# Patient Record
Sex: Female | Born: 1980 | Race: Black or African American | Hispanic: No | Marital: Single | Smoking: Current every day smoker
Health system: Southern US, Community
[De-identification: ages and names within clinical notes are randomized; demographics above are authoritative.]

## PROBLEM LIST (undated history)

## (undated) DIAGNOSIS — N83209 Unspecified ovarian cyst, unspecified side: Secondary | ICD-10-CM

## (undated) DIAGNOSIS — F419 Anxiety disorder, unspecified: Secondary | ICD-10-CM

## (undated) DIAGNOSIS — E7800 Pure hypercholesterolemia, unspecified: Secondary | ICD-10-CM

## (undated) HISTORY — PX: CHOLECYSTECTOMY: SHX55

---

## 2005-05-22 ENCOUNTER — Emergency Department: Payer: Self-pay | Admitting: Emergency Medicine

## 2005-09-08 ENCOUNTER — Emergency Department: Payer: Self-pay | Admitting: Emergency Medicine

## 2005-09-30 ENCOUNTER — Emergency Department: Payer: Self-pay | Admitting: Emergency Medicine

## 2005-10-04 ENCOUNTER — Other Ambulatory Visit: Payer: Self-pay

## 2005-10-04 ENCOUNTER — Emergency Department: Payer: Self-pay | Admitting: Internal Medicine

## 2006-01-20 ENCOUNTER — Emergency Department: Payer: Self-pay | Admitting: General Practice

## 2006-01-24 ENCOUNTER — Emergency Department: Payer: Self-pay | Admitting: Emergency Medicine

## 2006-03-17 ENCOUNTER — Emergency Department: Payer: Self-pay | Admitting: Emergency Medicine

## 2006-05-02 ENCOUNTER — Emergency Department: Payer: Self-pay | Admitting: Emergency Medicine

## 2006-05-02 ENCOUNTER — Other Ambulatory Visit: Payer: Self-pay

## 2006-09-06 ENCOUNTER — Emergency Department: Payer: Self-pay | Admitting: Emergency Medicine

## 2006-09-06 ENCOUNTER — Other Ambulatory Visit: Payer: Self-pay

## 2006-11-08 ENCOUNTER — Ambulatory Visit: Payer: Self-pay

## 2007-04-25 ENCOUNTER — Emergency Department: Payer: Self-pay | Admitting: Emergency Medicine

## 2007-04-25 ENCOUNTER — Other Ambulatory Visit: Payer: Self-pay

## 2007-06-28 ENCOUNTER — Emergency Department: Payer: Self-pay | Admitting: Emergency Medicine

## 2008-04-24 ENCOUNTER — Ambulatory Visit: Payer: Self-pay | Admitting: Family Medicine

## 2009-08-21 ENCOUNTER — Emergency Department: Payer: Self-pay | Admitting: Emergency Medicine

## 2010-01-03 ENCOUNTER — Emergency Department: Payer: Self-pay | Admitting: Emergency Medicine

## 2010-01-20 ENCOUNTER — Emergency Department: Payer: Self-pay | Admitting: Emergency Medicine

## 2010-06-19 ENCOUNTER — Emergency Department: Payer: Self-pay | Admitting: Unknown Physician Specialty

## 2010-06-29 ENCOUNTER — Ambulatory Visit: Payer: Self-pay | Admitting: Orthopedic Surgery

## 2011-01-06 ENCOUNTER — Ambulatory Visit: Payer: Self-pay | Admitting: Family Medicine

## 2011-01-22 ENCOUNTER — Observation Stay: Payer: Self-pay | Admitting: Obstetrics and Gynecology

## 2011-02-26 ENCOUNTER — Observation Stay: Payer: Self-pay | Admitting: Obstetrics and Gynecology

## 2011-03-16 ENCOUNTER — Observation Stay: Payer: Self-pay | Admitting: Obstetrics and Gynecology

## 2011-03-21 ENCOUNTER — Inpatient Hospital Stay: Payer: Self-pay | Admitting: Obstetrics and Gynecology

## 2011-05-06 ENCOUNTER — Inpatient Hospital Stay: Payer: Self-pay | Admitting: Surgery

## 2011-05-11 LAB — PATHOLOGY REPORT

## 2011-08-27 ENCOUNTER — Emergency Department: Payer: Self-pay | Admitting: Emergency Medicine

## 2012-03-06 ENCOUNTER — Emergency Department: Payer: Self-pay | Admitting: *Deleted

## 2012-03-06 LAB — COMPREHENSIVE METABOLIC PANEL
Albumin: 3.8 g/dL (ref 3.4–5.0)
Anion Gap: 13 (ref 7–16)
Bilirubin,Total: 0.4 mg/dL (ref 0.2–1.0)
Calcium, Total: 8.8 mg/dL (ref 8.5–10.1)
Creatinine: 0.92 mg/dL (ref 0.60–1.30)
EGFR (African American): >60
EGFR (Non-African Amer.): >60
Glucose: 106 mg/dL — ABNORMAL HIGH (ref 65–99)
Osmolality: 280 (ref 275–301)
Potassium: 3.3 mmol/L — ABNORMAL LOW (ref 3.5–5.1)
Sodium: 141 mmol/L (ref 136–145)
Total Protein: 8.2 g/dL (ref 6.4–8.2)

## 2012-03-06 LAB — CBC
HGB: 13.4 g/dL (ref 12.0–16.0)
MCHC: 33.8 g/dL (ref 32.0–36.0)
MCV: 81 fL (ref 80–100)
Platelet: 312 10*3/uL (ref 150–440)
RBC: 4.87 10*6/uL (ref 3.80–5.20)
WBC: 11.4 10*3/uL — ABNORMAL HIGH (ref 3.6–11.0)

## 2012-03-07 LAB — TROPONIN I: Troponin-I: <0.02 ng/mL

## 2012-04-11 ENCOUNTER — Ambulatory Visit: Payer: Self-pay | Admitting: Family Medicine

## 2012-09-09 ENCOUNTER — Emergency Department: Payer: Self-pay | Admitting: Emergency Medicine

## 2012-09-09 LAB — URINALYSIS, COMPLETE
Bilirubin,UR: NEGATIVE
Blood: NEGATIVE
Glucose,UR: NEGATIVE mg/dL (ref 0–75)
Nitrite: NEGATIVE
Protein: NEGATIVE
Specific Gravity: 1.02 (ref 1.003–1.030)
Squamous Epithelial: 2

## 2012-09-09 LAB — COMPREHENSIVE METABOLIC PANEL
Albumin: 3.8 g/dL (ref 3.4–5.0)
Alkaline Phosphatase: 73 U/L (ref 50–136)
Anion Gap: 10 (ref 7–16)
BUN: 6 mg/dL — ABNORMAL LOW (ref 7–18)
Calcium, Total: 9.2 mg/dL (ref 8.5–10.1)
Creatinine: 0.87 mg/dL (ref 0.60–1.30)
Glucose: 97 mg/dL (ref 65–99)
Osmolality: 279 (ref 275–301)
Potassium: 3.3 mmol/L — ABNORMAL LOW (ref 3.5–5.1)
SGPT (ALT): 13 U/L (ref 12–78)
Sodium: 141 mmol/L (ref 136–145)
Total Protein: 8.2 g/dL (ref 6.4–8.2)

## 2012-09-09 LAB — CBC
HGB: 14.1 g/dL (ref 12.0–16.0)
MCH: 28.2 pg (ref 26.0–34.0)
MCHC: 34.1 g/dL (ref 32.0–36.0)
MCV: 83 fL (ref 80–100)
RBC: 5.01 10*6/uL (ref 3.80–5.20)

## 2012-09-09 LAB — TROPONIN I: Troponin-I: <0.02 ng/mL

## 2012-12-24 ENCOUNTER — Emergency Department: Payer: Self-pay | Admitting: Emergency Medicine

## 2012-12-24 LAB — COMPREHENSIVE METABOLIC PANEL
Albumin: 4.1 g/dL (ref 3.4–5.0)
Alkaline Phosphatase: 79 U/L (ref 50–136)
Bilirubin,Total: 0.5 mg/dL (ref 0.2–1.0)
Chloride: 109 mmol/L — ABNORMAL HIGH (ref 98–107)
Co2: 27 mmol/L (ref 21–32)
Creatinine: 0.86 mg/dL (ref 0.60–1.30)
EGFR (African American): >60
EGFR (Non-African Amer.): >60
Osmolality: 277 (ref 275–301)
SGOT(AST): 18 U/L (ref 15–37)
Sodium: 140 mmol/L (ref 136–145)

## 2012-12-24 LAB — URINALYSIS, COMPLETE
Ketone: NEGATIVE
Ph: 6 (ref 4.5–8.0)
Protein: NEGATIVE
RBC,UR: <1 /HPF (ref 0–5)
WBC UR: <1 /HPF (ref 0–5)

## 2012-12-24 LAB — CBC
HCT: 43.2 % (ref 35.0–47.0)
MCHC: 34.5 g/dL (ref 32.0–36.0)
MCV: 83 fL (ref 80–100)
Platelet: 312 10*3/uL (ref 150–440)
RDW: 14.5 % (ref 11.5–14.5)
WBC: 9 10*3/uL (ref 3.6–11.0)

## 2012-12-24 LAB — PREGNANCY, URINE: Pregnancy Test, Urine: NEGATIVE m[IU]/mL

## 2013-04-12 ENCOUNTER — Ambulatory Visit: Payer: Self-pay | Admitting: Family Medicine

## 2013-05-06 ENCOUNTER — Emergency Department: Payer: Self-pay | Admitting: Emergency Medicine

## 2013-05-06 LAB — BASIC METABOLIC PANEL
BUN: 4 mg/dL — ABNORMAL LOW (ref 7–18)
Calcium, Total: 9.3 mg/dL (ref 8.5–10.1)
Co2: 24 mmol/L (ref 21–32)
EGFR (Non-African Amer.): >60
Potassium: 3.5 mmol/L (ref 3.5–5.1)

## 2013-05-06 LAB — URINALYSIS, COMPLETE
Bilirubin,UR: NEGATIVE
Blood: NEGATIVE
Nitrite: NEGATIVE
RBC,UR: 1 /HPF (ref 0–5)
Specific Gravity: 1.028 (ref 1.003–1.030)
Squamous Epithelial: 1

## 2013-05-06 LAB — CBC
HCT: 41 % (ref 35.0–47.0)
MCH: 28.6 pg (ref 26.0–34.0)
MCHC: 34.9 g/dL (ref 32.0–36.0)
MCV: 82 fL (ref 80–100)
RDW: 14.7 % — ABNORMAL HIGH (ref 11.5–14.5)

## 2013-06-06 ENCOUNTER — Ambulatory Visit: Payer: Self-pay | Admitting: Family Medicine

## 2013-10-13 ENCOUNTER — Emergency Department: Payer: Self-pay | Admitting: Emergency Medicine

## 2013-10-13 LAB — CK TOTAL AND CKMB (NOT AT ARMC): CK, Total: 114 U/L (ref 21–215)

## 2013-10-13 LAB — COMPREHENSIVE METABOLIC PANEL
Albumin: 3.8 g/dL (ref 3.4–5.0)
Anion Gap: 6 — ABNORMAL LOW (ref 7–16)
Calcium, Total: 9.1 mg/dL (ref 8.5–10.1)
Chloride: 108 mmol/L — ABNORMAL HIGH (ref 98–107)
Co2: 25 mmol/L (ref 21–32)
Creatinine: 0.85 mg/dL (ref 0.60–1.30)
EGFR (Non-African Amer.): >60
Potassium: 3.3 mmol/L — ABNORMAL LOW (ref 3.5–5.1)
SGPT (ALT): 18 U/L (ref 12–78)
Sodium: 139 mmol/L (ref 136–145)

## 2013-10-13 LAB — CBC
MCH: 28.6 pg (ref 26.0–34.0)
MCHC: 34.2 g/dL (ref 32.0–36.0)
MCV: 84 fL (ref 80–100)
RBC: 4.89 10*6/uL (ref 3.80–5.20)
RDW: 14.3 % (ref 11.5–14.5)
WBC: 11.1 10*3/uL — ABNORMAL HIGH (ref 3.6–11.0)

## 2013-10-14 ENCOUNTER — Emergency Department: Payer: Self-pay | Admitting: Emergency Medicine

## 2013-10-14 LAB — BASIC METABOLIC PANEL
Anion Gap: 5 — ABNORMAL LOW (ref 7–16)
BUN: 5 mg/dL — ABNORMAL LOW (ref 7–18)
Calcium, Total: 9 mg/dL (ref 8.5–10.1)
Chloride: 109 mmol/L — ABNORMAL HIGH (ref 98–107)
Creatinine: 0.79 mg/dL (ref 0.60–1.30)
EGFR (Non-African Amer.): >60
Glucose: 90 mg/dL (ref 65–99)
Osmolality: 271 (ref 275–301)
Potassium: 3.5 mmol/L (ref 3.5–5.1)
Sodium: 137 mmol/L (ref 136–145)

## 2013-10-14 LAB — TSH: Thyroid Stimulating Horm: 1.15 u[IU]/mL

## 2013-10-14 LAB — CBC
HCT: 39.2 % (ref 35.0–47.0)
MCH: 28.9 pg (ref 26.0–34.0)
Platelet: 300 10*3/uL (ref 150–440)
RBC: 4.74 10*6/uL (ref 3.80–5.20)
RDW: 13.9 % (ref 11.5–14.5)

## 2013-10-14 LAB — CK TOTAL AND CKMB (NOT AT ARMC)
CK, Total: 101 U/L (ref 21–215)
CK-MB: 0.8 ng/mL (ref 0.5–3.6)

## 2013-10-14 LAB — TROPONIN I: Troponin-I: <0.02 ng/mL

## 2014-01-06 ENCOUNTER — Emergency Department: Payer: Self-pay | Admitting: Emergency Medicine

## 2014-02-05 ENCOUNTER — Other Ambulatory Visit: Payer: Self-pay | Admitting: Orthopedic Surgery

## 2014-02-05 DIAGNOSIS — M545 Low back pain, unspecified: Secondary | ICD-10-CM

## 2014-02-11 ENCOUNTER — Other Ambulatory Visit: Payer: Self-pay

## 2014-04-18 ENCOUNTER — Emergency Department: Payer: Self-pay | Admitting: Emergency Medicine

## 2014-06-16 ENCOUNTER — Emergency Department: Payer: Self-pay | Admitting: Emergency Medicine

## 2014-06-16 LAB — COMPREHENSIVE METABOLIC PANEL
Albumin: 3.8 g/dL (ref 3.4–5.0)
Alkaline Phosphatase: 58 U/L
Anion Gap: 6 — ABNORMAL LOW (ref 7–16)
BUN: 5 mg/dL — ABNORMAL LOW (ref 7–18)
Bilirubin,Total: 0.8 mg/dL (ref 0.2–1.0)
CREATININE: 0.82 mg/dL (ref 0.60–1.30)
Calcium, Total: 9.1 mg/dL (ref 8.5–10.1)
Chloride: 107 mmol/L (ref 98–107)
Co2: 23 mmol/L (ref 21–32)
EGFR (Non-African Amer.): >60
Glucose: 77 mg/dL (ref 65–99)
OSMOLALITY: 268 (ref 275–301)
Potassium: 3.6 mmol/L (ref 3.5–5.1)
SGOT(AST): 20 U/L (ref 15–37)
SGPT (ALT): 16 U/L (ref 12–78)
Sodium: 136 mmol/L (ref 136–145)
TOTAL PROTEIN: 8.1 g/dL (ref 6.4–8.2)

## 2014-06-16 LAB — URINALYSIS, COMPLETE
BACTERIA: NONE SEEN
Bilirubin,UR: NEGATIVE
Blood: NEGATIVE
Glucose,UR: NEGATIVE mg/dL (ref 0–75)
KETONE: NEGATIVE
Leukocyte Esterase: NEGATIVE
Nitrite: NEGATIVE
Ph: 7 (ref 4.5–8.0)
Protein: NEGATIVE
Specific Gravity: 1.02 (ref 1.003–1.030)
Squamous Epithelial: <1
WBC UR: <1 /HPF (ref 0–5)

## 2014-06-16 LAB — CBC
HCT: 43.9 % (ref 35.0–47.0)
HGB: 14.1 g/dL (ref 12.0–16.0)
MCH: 28.1 pg (ref 26.0–34.0)
MCHC: 32.1 g/dL (ref 32.0–36.0)
MCV: 87 fL (ref 80–100)
PLATELETS: 304 10*3/uL (ref 150–440)
RBC: 5.03 10*6/uL (ref 3.80–5.20)
RDW: 14.3 % (ref 11.5–14.5)
WBC: 10.8 10*3/uL (ref 3.6–11.0)

## 2015-01-23 ENCOUNTER — Emergency Department: Payer: Self-pay | Admitting: Emergency Medicine

## 2015-01-23 LAB — COMPREHENSIVE METABOLIC PANEL
ALBUMIN: 3.8 g/dL (ref 3.4–5.0)
AST: 20 U/L (ref 15–37)
Alkaline Phosphatase: 55 U/L (ref 46–116)
Anion Gap: 6 — ABNORMAL LOW (ref 7–16)
BUN: 5 mg/dL — ABNORMAL LOW (ref 7–18)
Bilirubin,Total: 0.8 mg/dL (ref 0.2–1.0)
CHLORIDE: 105 mmol/L (ref 98–107)
Calcium, Total: 8.9 mg/dL (ref 8.5–10.1)
Co2: 26 mmol/L (ref 21–32)
Creatinine: 0.92 mg/dL (ref 0.60–1.30)
EGFR (African American): >60
EGFR (Non-African Amer.): >60
GLUCOSE: 89 mg/dL (ref 65–99)
Osmolality: 271 (ref 275–301)
Potassium: 3.3 mmol/L — ABNORMAL LOW (ref 3.5–5.1)
SGPT (ALT): 13 U/L — ABNORMAL LOW (ref 14–63)
Sodium: 137 mmol/L (ref 136–145)
TOTAL PROTEIN: 8.3 g/dL — AB (ref 6.4–8.2)

## 2015-01-23 LAB — CBC
HCT: 44.6 % (ref 35.0–47.0)
HGB: 14.8 g/dL (ref 12.0–16.0)
MCH: 28.1 pg (ref 26.0–34.0)
MCHC: 33.1 g/dL (ref 32.0–36.0)
MCV: 85 fL (ref 80–100)
Platelet: 296 10*3/uL (ref 150–440)
RBC: 5.26 10*6/uL — ABNORMAL HIGH (ref 3.80–5.20)
RDW: 13.8 % (ref 11.5–14.5)
WBC: 13.9 10*3/uL — ABNORMAL HIGH (ref 3.6–11.0)

## 2015-01-23 LAB — TROPONIN I: Troponin-I: <0.02 ng/mL

## 2015-01-24 LAB — D-DIMER(ARMC): D-DIMER: 319 ng/mL

## 2015-01-24 LAB — PREGNANCY, URINE: Pregnancy Test, Urine: NEGATIVE m[IU]/mL

## 2015-01-24 LAB — LIPASE, BLOOD: LIPASE: 120 U/L (ref 73–393)

## 2015-08-25 ENCOUNTER — Encounter: Payer: Self-pay | Admitting: Urgent Care

## 2015-08-25 ENCOUNTER — Other Ambulatory Visit: Payer: Self-pay | Admitting: Primary Care

## 2015-08-25 ENCOUNTER — Emergency Department
Admission: EM | Admit: 2015-08-25 | Discharge: 2015-08-25 | Discharge status: Against Medical Advice | Payer: Medicaid Other | Attending: Emergency Medicine | Admitting: Emergency Medicine

## 2015-08-25 DIAGNOSIS — R109 Unspecified abdominal pain: Secondary | ICD-10-CM | POA: Insufficient documentation

## 2015-08-25 DIAGNOSIS — M545 Low back pain: Secondary | ICD-10-CM | POA: Insufficient documentation

## 2015-08-25 DIAGNOSIS — Z720 Tobacco use: Secondary | ICD-10-CM | POA: Insufficient documentation

## 2015-08-25 DIAGNOSIS — R1031 Right lower quadrant pain: Secondary | ICD-10-CM

## 2015-08-25 LAB — URINALYSIS COMPLETE WITH MICROSCOPIC (ARMC ONLY)
BILIRUBIN URINE: NEGATIVE
Bacteria, UA: NONE SEEN
Glucose, UA: NEGATIVE mg/dL
HGB URINE DIPSTICK: NEGATIVE
LEUKOCYTES UA: NEGATIVE
NITRITE: NEGATIVE
PH: 5 (ref 5.0–8.0)
Protein, ur: 30 mg/dL — AB
SPECIFIC GRAVITY, URINE: 1.029 (ref 1.005–1.030)

## 2015-08-25 LAB — CBC
HCT: 42.2 % (ref 35.0–47.0)
Hemoglobin: 14.2 g/dL (ref 12.0–16.0)
MCH: 29.1 pg (ref 26.0–34.0)
MCHC: 33.7 g/dL (ref 32.0–36.0)
MCV: 86.3 fL (ref 80.0–100.0)
PLATELETS: 303 10*3/uL (ref 150–440)
RBC: 4.89 MIL/uL (ref 3.80–5.20)
RDW: 14.8 % — ABNORMAL HIGH (ref 11.5–14.5)
WBC: 10.4 10*3/uL (ref 3.6–11.0)

## 2015-08-25 LAB — BASIC METABOLIC PANEL
ANION GAP: 7 (ref 5–15)
BUN: 5 mg/dL — ABNORMAL LOW (ref 6–20)
CALCIUM: 8.9 mg/dL (ref 8.9–10.3)
CO2: 24 mmol/L (ref 22–32)
CREATININE: 0.76 mg/dL (ref 0.44–1.00)
Chloride: 106 mmol/L (ref 101–111)
GFR calc Af Amer: >60 mL/min (ref >60)
Glucose, Bld: 87 mg/dL (ref 65–99)
Potassium: 3.4 mmol/L — ABNORMAL LOW (ref 3.5–5.1)
SODIUM: 137 mmol/L (ref 135–145)

## 2015-08-25 LAB — POCT PREGNANCY, URINE: Preg Test, Ur: NEGATIVE

## 2015-08-25 NOTE — ED Notes (Signed)
Patient presents with c/o RIGHT lower back pain with (+) radiation into flank area. (+) N/V. Denies urinary symptoms. Patient presented to [facility] today and was advised that it was "either cysts on my ovaries or appendicitis."

## 2015-08-26 ENCOUNTER — Ambulatory Visit
Admission: RE | Admit: 2015-08-26 | Discharge: 2015-08-26 | Disposition: A | Discharge status: Home / Self Care | Payer: Medicaid Other | Source: Ambulatory Visit | Attending: Primary Care | Admitting: Primary Care

## 2015-08-26 DIAGNOSIS — N8320 Unspecified ovarian cysts: Secondary | ICD-10-CM | POA: Insufficient documentation

## 2015-08-26 DIAGNOSIS — R1031 Right lower quadrant pain: Secondary | ICD-10-CM | POA: Insufficient documentation

## 2015-11-09 ENCOUNTER — Emergency Department
Admission: EM | Admit: 2015-11-09 | Discharge: 2015-11-10 | Disposition: A | Discharge status: Home / Self Care | Payer: Medicaid Other | Attending: Emergency Medicine | Admitting: Emergency Medicine

## 2015-11-09 ENCOUNTER — Emergency Department: Payer: Medicaid Other

## 2015-11-09 ENCOUNTER — Encounter: Payer: Self-pay | Admitting: Emergency Medicine

## 2015-11-09 DIAGNOSIS — R51 Headache: Secondary | ICD-10-CM | POA: Diagnosis not present

## 2015-11-09 DIAGNOSIS — H538 Other visual disturbances: Secondary | ICD-10-CM | POA: Insufficient documentation

## 2015-11-09 DIAGNOSIS — R079 Chest pain, unspecified: Secondary | ICD-10-CM | POA: Diagnosis not present

## 2015-11-09 DIAGNOSIS — F17210 Nicotine dependence, cigarettes, uncomplicated: Secondary | ICD-10-CM | POA: Insufficient documentation

## 2015-11-09 DIAGNOSIS — R519 Headache, unspecified: Secondary | ICD-10-CM

## 2015-11-09 DIAGNOSIS — Z7982 Long term (current) use of aspirin: Secondary | ICD-10-CM | POA: Insufficient documentation

## 2015-11-09 LAB — BASIC METABOLIC PANEL
Anion gap: 4 — ABNORMAL LOW (ref 5–15)
BUN: 10 mg/dL (ref 6–20)
CALCIUM: 9 mg/dL (ref 8.9–10.3)
CO2: 23 mmol/L (ref 22–32)
CREATININE: 0.68 mg/dL (ref 0.44–1.00)
Chloride: 110 mmol/L (ref 101–111)
GFR calc Af Amer: >60 mL/min (ref >60)
GLUCOSE: 93 mg/dL (ref 65–99)
Potassium: 3.6 mmol/L (ref 3.5–5.1)
SODIUM: 137 mmol/L (ref 135–145)

## 2015-11-09 LAB — CBC
HCT: 42 % (ref 35.0–47.0)
Hemoglobin: 14.6 g/dL (ref 12.0–16.0)
MCH: 29.3 pg (ref 26.0–34.0)
MCHC: 34.8 g/dL (ref 32.0–36.0)
MCV: 84.3 fL (ref 80.0–100.0)
PLATELETS: 351 10*3/uL (ref 150–440)
RBC: 4.99 MIL/uL (ref 3.80–5.20)
RDW: 14.3 % (ref 11.5–14.5)
WBC: 10 10*3/uL (ref 3.6–11.0)

## 2015-11-09 LAB — TROPONIN I: Troponin I: <0.03 ng/mL (ref <0.031)

## 2015-11-09 NOTE — ED Notes (Signed)
Pt to triage via w/c with no distress noted; pt reports x hour having mid CP and frontal HA; denies hx of same

## 2015-11-10 ENCOUNTER — Emergency Department: Payer: Medicaid Other

## 2015-11-10 MED ORDER — GADOBENATE DIMEGLUMINE 529 MG/ML IV SOLN
20.0000 mL | Freq: Once | INTRAVENOUS | Status: AC | PRN
  Administered 2015-11-10: 18 mL via INTRAVENOUS

## 2015-11-10 MED ORDER — ONDANSETRON HCL 4 MG/2ML IJ SOLN
4.0000 mg | Freq: Once | INTRAMUSCULAR | Status: AC
  Administered 2015-11-10: 4 mg via INTRAVENOUS
  Filled 2015-11-10: qty 2

## 2015-11-10 MED ORDER — MORPHINE SULFATE (PF) 2 MG/ML IV SOLN
2.0000 mg | Freq: Once | INTRAVENOUS | Status: AC
  Administered 2015-11-10: 2 mg via INTRAVENOUS
  Filled 2015-11-10: qty 1

## 2015-11-10 NOTE — ED Notes (Signed)
Patient transported to MRI via stretcher.

## 2015-11-10 NOTE — ED Provider Notes (Signed)
[facility] Emergency Department Provider Note  ____________________________________________  Time seen: 12:15 AM I have reviewed the triage vital signs and the nursing notes.   HISTORY  Chief Complaint Chest Pain and Headache      HPI Robin Saunders is a 34 y.o. female presents with acute onset of frontal headache accompanied by blurred vision. Patient states blurred vision was a such that she was unable to drive herself to the emergency department.     Past medical history None There are no active problems to display for this patient.   Past Surgical History  Procedure Laterality Date  . Cholecystectomy      Current Outpatient Rx  Name  Route  Sig  Dispense  Refill  . aspirin 81 MG tablet   Oral   Take 81 mg by mouth daily.         Marland Kitchen ibuprofen (ADVIL,MOTRIN) 800 MG tablet   Oral   Take 800 mg by mouth every 8 (eight) hours as needed.           Allergies Tylenol  No family history on file.  Social History Social History  Substance Use Topics  . Smoking status: Current Every Day Smoker -- 1.00 packs/day    Types: Cigarettes  . Smokeless tobacco: None  . Alcohol Use: No    Review of Systems  Constitutional: Negative for fever. Eyes: Negative for visual changes. ENT: Negative for sore throat. Cardiovascular: Negative for chest pain. Respiratory: Negative for shortness of breath. Gastrointestinal: Negative for abdominal pain, vomiting and diarrhea. Genitourinary: Negative for dysuria. Musculoskeletal: Negative for back pain. Skin: Negative for rash. Neurological: Positive for headache and blurred vision   10-point ROS otherwise negative.  ____________________________________________   PHYSICAL EXAM:  VITAL SIGNS: ED Triage Vitals  Enc Vitals Group     BP 11/09/15 2051 130/76 mmHg     Pulse Rate 11/09/15 2051 65     Resp 11/09/15 2051 18     Temp 11/09/15 2051 98 F (36.7 C)     Temp Source 11/09/15 2051  Oral     SpO2 11/09/15 2051 98 %     Weight 11/09/15 2051 195 lb (88.451 kg)     Height 11/09/15 2051 5\' 6"  (1.676 m)     Head Cir --      Peak Flow --      Pain Score 11/09/15 2047 7     Pain Loc --      Pain Edu? --      Excl. in [place]? --      Constitutional: Alert and oriented. Well appearing and in no distress. Eyes: Conjunctivae are normal. PERRL. Normal extraocular movements. ENT   Head: Normocephalic and atraumatic.   Nose: No congestion/rhinnorhea.   Mouth/Throat: Mucous membranes are moist.   Neck: No stridor. Hematological/Lymphatic/Immunilogical: No cervical lymphadenopathy. Cardiovascular: Normal rate, regular rhythm. Normal and symmetric distal pulses are present in all extremities. No murmurs, rubs, or gallops. Respiratory: Normal respiratory effort without tachypnea nor retractions. Breath sounds are clear and equal bilaterally. No wheezes/rales/rhonchi. Gastrointestinal: Soft and nontender. No distention. There is no CVA tenderness. Genitourinary: deferred Musculoskeletal: Nontender with normal range of motion in all extremities. No joint effusions.  No lower extremity tenderness nor edema. Neurologic:  Normal speech and language. No gross focal neurologic deficits are appreciated. Speech is normal.  Skin:  Skin is warm, dry and intact. No rash noted. Psychiatric: Mood and affect are normal. Speech and behavior are normal. Patient exhibits appropriate insight  and judgment.  ____________________________________________    LABS (pertinent positives/negatives)  Labs Reviewed  BASIC METABOLIC PANEL - Abnormal; Notable for the following:    Anion gap 4 (*)    All other components within normal limits  CBC  TROPONIN I     ____________________________________________   EKG  ED ECG REPORT I, Derward Marple,  N, the attending physician, personally viewed and interpreted this ECG.   Date: 11/10/2015  EKG Time: 4:53 AM  Rate: 107  Rhythm: Sinus  tachycardia  Axis: None  Intervals: Normal  ST&T Change: None   ____________________________________________    RADIOLOGY  MR Brain W Wo Contrast (Final result) Result time: 11/10/15 04:43:25   Final result by Rad Results In Interface (11/10/15 04:43:25)   Narrative:   CLINICAL DATA: Initial valuation for acute frontal headache with blurry vision.  EXAM: MRI HEAD WITHOUT AND WITH CONTRAST  TECHNIQUE: Multiplanar, multiecho pulse sequences of the brain and surrounding structures were obtained without and with intravenous contrast.  CONTRAST: 30mL MULTIHANCE GADOBENATE DIMEGLUMINE 529 MG/ML IV SOLN  COMPARISON: Prior CT from earlier the same day.  FINDINGS: The CSF containing spaces are within normal limits for patient age. No focal parenchymal signal abnormality is identified. No mass lesion, midline shift, or extra-axial fluid collection. Ventricles are normal in size without evidence of hydrocephalus.  No diffusion-weighted signal abnormality is identified to suggest acute intracranial infarct. Gray-white matter differentiation is maintained. Normal flow voids are seen within the intracranial vasculature. No intracranial hemorrhage identified.  The cervicomedullary junction is normal. Pituitary gland is within normal limits. Pituitary stalk is midline. The globes and optic nerves demonstrate a normal appearance with normal signal intensity.  No abnormal enhancement.  The bone marrow signal intensity is normal. Calvarium is intact. Visualized upper cervical spine is within normal limits.  Scalp soft tissues are unremarkable.  Attention cyst within the right maxillary sinus. Paranasal sinuses are otherwise clear. No mastoid effusion.  IMPRESSION: Normal MRI of the brain.   Electronically Signed By: Jeannine Boga M.D. On: 11/10/2015 04:43          CT Head Wo Contrast (Final result) Result time: 11/10/15 00:12:57   Final result by Rad  Results In Interface (11/10/15 00:12:57)   Narrative:   CLINICAL DATA: Acute onset of frontal headache and blurred vision. Initial encounter.  EXAM: CT HEAD WITHOUT CONTRAST  TECHNIQUE: Contiguous axial images were obtained from the base of the skull through the vertex without intravenous contrast.  COMPARISON: None.  FINDINGS: There is no evidence of acute infarction, mass lesion, or intra- or extra-axial hemorrhage on CT.  The posterior fossa, including the cerebellum, brainstem and fourth ventricle, is within normal limits. The third and lateral ventricles, and basal ganglia are unremarkable in appearance. The cerebral hemispheres are symmetric in appearance, with normal gray-white differentiation. No mass effect or midline shift is seen.  There is no evidence of fracture; visualized osseous structures are unremarkable in appearance. The visualized portions of the orbits are within normal limits. The paranasal sinuses and mastoid air cells are well-aerated. No significant soft tissue abnormalities are seen.  IMPRESSION: Unremarkable noncontrast CT of the head.   Electronically Signed By: Garald Balding M.D. On: 11/10/2015 00:12          DG Chest 2 View (Final result) Result time: 11/09/15 21:22:50   Final result by Rad Results In Interface (11/09/15 21:22:50)   Narrative:   CLINICAL DATA: Acute onset of mid chest pain. Initial encounter.  EXAM: CHEST 2 VIEW  COMPARISON: Chest  radiograph performed 01/24/2015  FINDINGS: The lungs are well-aerated and clear. There is no evidence of focal opacification, pleural effusion or pneumothorax.  The heart is normal in size; the mediastinal contour is within normal limits. No acute osseous abnormalities are seen.  IMPRESSION: No acute cardiopulmonary process seen.   Electronically Signed By: Garald Balding M.D. On: 11/09/2015 21:22      INITIAL IMPRESSION / ASSESSMENT AND PLAN / ED  COURSE  Pertinent labs & imaging results that were available during my care of the patient were reviewed by me and considered in my medical decision making (see chart for details).  CT scan of the head revealed no gross abnormality however given patient's history of inability to see clearly secondary to profound blurred vision MRI of the brain was performed which was negative. Patient states that her vision is now back to normal.   ____________________________________________   FINAL CLINICAL IMPRESSION(S) / ED DIAGNOSES  Final diagnoses:  Acute nonintractable headache, unspecified headache type      Gregor Hams, MD 11/10/15 [phone]

## 2015-11-10 NOTE — ED Notes (Signed)
Pt returned from MRI await results and dispo.

## 2015-11-10 NOTE — Discharge Instructions (Signed)

## 2015-11-10 NOTE — ED Notes (Signed)
Pt taken to MRI  

## 2015-11-10 NOTE — ED Notes (Signed)
Pt returned from MRI °

## 2015-11-10 NOTE — ED Notes (Signed)
Pt remains in MRI 

## 2016-01-07 ENCOUNTER — Emergency Department
Admission: EM | Admit: 2016-01-07 | Discharge: 2016-01-07 | Disposition: A | Discharge status: Home / Self Care | Payer: Medicaid Other | Attending: Emergency Medicine | Admitting: Emergency Medicine

## 2016-01-07 ENCOUNTER — Encounter: Payer: Self-pay | Admitting: Medical Oncology

## 2016-01-07 ENCOUNTER — Emergency Department: Payer: Medicaid Other

## 2016-01-07 DIAGNOSIS — R1031 Right lower quadrant pain: Secondary | ICD-10-CM | POA: Diagnosis not present

## 2016-01-07 DIAGNOSIS — R112 Nausea with vomiting, unspecified: Secondary | ICD-10-CM | POA: Insufficient documentation

## 2016-01-07 DIAGNOSIS — F17210 Nicotine dependence, cigarettes, uncomplicated: Secondary | ICD-10-CM | POA: Diagnosis not present

## 2016-01-07 DIAGNOSIS — Z3202 Encounter for pregnancy test, result negative: Secondary | ICD-10-CM | POA: Insufficient documentation

## 2016-01-07 DIAGNOSIS — Z7982 Long term (current) use of aspirin: Secondary | ICD-10-CM | POA: Insufficient documentation

## 2016-01-07 DIAGNOSIS — R630 Anorexia: Secondary | ICD-10-CM | POA: Insufficient documentation

## 2016-01-07 HISTORY — DX: Pure hypercholesterolemia, unspecified: E78.00

## 2016-01-07 HISTORY — DX: Anxiety disorder, unspecified: F41.9

## 2016-01-07 HISTORY — DX: Unspecified ovarian cyst, unspecified side: N83.209

## 2016-01-07 LAB — URINALYSIS COMPLETE WITH MICROSCOPIC (ARMC ONLY)
BACTERIA UA: NONE SEEN
BILIRUBIN URINE: NEGATIVE
GLUCOSE, UA: NEGATIVE mg/dL
KETONES UR: NEGATIVE mg/dL
LEUKOCYTES UA: NEGATIVE
NITRITE: NEGATIVE
PH: 8 (ref 5.0–8.0)
Protein, ur: NEGATIVE mg/dL
Specific Gravity, Urine: 1.008 (ref 1.005–1.030)

## 2016-01-07 LAB — CBC
HEMATOCRIT: 42.3 % (ref 35.0–47.0)
Hemoglobin: 14.3 g/dL (ref 12.0–16.0)
MCH: 28.5 pg (ref 26.0–34.0)
MCHC: 33.7 g/dL (ref 32.0–36.0)
MCV: 84.5 fL (ref 80.0–100.0)
PLATELETS: 315 10*3/uL (ref 150–440)
RBC: 5.01 MIL/uL (ref 3.80–5.20)
RDW: 14 % (ref 11.5–14.5)
WBC: 12.9 10*3/uL — AB (ref 3.6–11.0)

## 2016-01-07 LAB — COMPREHENSIVE METABOLIC PANEL
ALBUMIN: 4.4 g/dL (ref 3.5–5.0)
ALT: 13 U/L — AB (ref 14–54)
AST: 16 U/L (ref 15–41)
Alkaline Phosphatase: 48 U/L (ref 38–126)
Anion gap: 6 (ref 5–15)
BILIRUBIN TOTAL: 1 mg/dL (ref 0.3–1.2)
BUN: 6 mg/dL (ref 6–20)
CO2: 23 mmol/L (ref 22–32)
CREATININE: 0.71 mg/dL (ref 0.44–1.00)
Calcium: 9.1 mg/dL (ref 8.9–10.3)
Chloride: 106 mmol/L (ref 101–111)
GFR calc Af Amer: >60 mL/min (ref >60)
GLUCOSE: 96 mg/dL (ref 65–99)
POTASSIUM: 3.5 mmol/L (ref 3.5–5.1)
Sodium: 135 mmol/L (ref 135–145)
TOTAL PROTEIN: 8.2 g/dL — AB (ref 6.5–8.1)

## 2016-01-07 LAB — POCT PREGNANCY, URINE: Preg Test, Ur: NEGATIVE

## 2016-01-07 LAB — LIPASE, BLOOD: Lipase: 21 U/L (ref 11–51)

## 2016-01-07 MED ORDER — DICYCLOMINE HCL 20 MG PO TABS: 20.0000 mg | ORAL_TABLET | Freq: Three times a day (TID) | ORAL | Status: DC | PRN

## 2016-01-07 MED ORDER — IOHEXOL 300 MG/ML  SOLN
100.0000 mL | Freq: Once | INTRAMUSCULAR | Status: AC | PRN
  Administered 2016-01-07: 100 mL via INTRAVENOUS
  Filled 2016-01-07: qty 100

## 2016-01-07 MED ORDER — SODIUM CHLORIDE 0.9 % IV BOLUS (SEPSIS)
500.0000 mL | Freq: Once | INTRAVENOUS | Status: AC
  Administered 2016-01-07: 500 mL via INTRAVENOUS

## 2016-01-07 MED ORDER — KETOROLAC TROMETHAMINE 30 MG/ML IJ SOLN
15.0000 mg | Freq: Once | INTRAMUSCULAR | Status: AC
  Administered 2016-01-07: 15 mg via INTRAVENOUS
  Filled 2016-01-07: qty 1

## 2016-01-07 MED ORDER — IOHEXOL 240 MG/ML SOLN
25.0000 mL | Freq: Once | INTRAMUSCULAR | Status: AC | PRN
  Administered 2016-01-07: 25 mL via ORAL
  Filled 2016-01-07: qty 25

## 2016-01-07 MED ORDER — ONDANSETRON HCL 4 MG/2ML IJ SOLN
4.0000 mg | Freq: Once | INTRAMUSCULAR | Status: AC
  Administered 2016-01-07: 4 mg via INTRAVENOUS
  Filled 2016-01-07: qty 2

## 2016-01-07 NOTE — Discharge Instructions (Signed)
Abdominal Pain, Adult Many things can cause abdominal pain. Usually, abdominal pain is not caused by a disease and will improve without treatment. It can often be observed and treated at home. Your health care provider will do a physical exam and possibly order blood tests and X-rays to help determine the seriousness of your pain. However, in many cases, more time must pass before a clear cause of the pain can be found. Before that point, your health care provider may not know if you need more testing or further treatment. HOME CARE INSTRUCTIONS Monitor your abdominal pain for any changes. The following actions may help to alleviate any discomfort you are experiencing:  Only take over-the-counter or prescription medicines as directed by your health care provider.  Do not take laxatives unless directed to do so by your health care provider.  Try a clear liquid diet (broth, tea, or water) as directed by your health care provider. Slowly move to a bland diet as tolerated. SEEK MEDICAL CARE IF:  You have unexplained abdominal pain.  You have abdominal pain associated with nausea or diarrhea.  You have pain when you urinate or have a bowel movement.  You experience abdominal pain that wakes you in the night.  You have abdominal pain that is worsened or improved by eating food.  You have abdominal pain that is worsened with eating fatty foods.  You have a fever. SEEK IMMEDIATE MEDICAL CARE IF:  Your pain does not go away within 2 hours.  You keep throwing up (vomiting).  Your pain is felt only in portions of the abdomen, such as the right side or the left lower portion of the abdomen.  You pass bloody or black tarry stools. MAKE SURE YOU:  Understand these instructions.  Will watch your condition.  Will get help right away if you are not doing well or get worse.   This information is not intended to replace advice given to you by your health care provider. Make sure you discuss  any questions you have with your health care provider.   Document Released: 09/21/2005 Document Revised: 09/02/2015 Document Reviewed: 08/21/2013 Elsevier Interactive Patient Education [employer].  Please return immediately if condition worsens. Please contact her primary physician or the physician you were given for referral. If you have any specialist physicians involved in her treatment and plan please also contact them. Thank you for using [facility]. Return especially for fever, bloody stool, persistent vomiting, or any other new concerns.

## 2016-01-07 NOTE — ED Notes (Signed)
Attempted iv access without success.  t teer medic to attempt now.   Pt drinking contrast.

## 2016-01-07 NOTE — ED Notes (Signed)
22 gauge iv L ac present on this rn arrival. Removed iv. Catheter intact.

## 2016-01-07 NOTE — ED Provider Notes (Signed)
Time Seen: Approximately ----------------------------------------- 2:11 PM on 01/07/2016 -----------------------------------------    I have reviewed the triage notes  Chief Complaint: Abdominal Pain and Nausea   History of Present Illness: Robin Saunders is a 35 y.o. female who states that she started developing abdominal pain last evening approximately 70 8:00. She's had some nausea, vomited 2 no blood or bile. She states she still feels nauseated with decreased appetite. She denies any urinary or bowel complaints. She states her pain is constant and worse with movement. Any fever and work the third shift and then came here to clinic and was evaluated and referred to the emergency department for possible appendicitis.   Past Medical History  Diagnosis Date  . Cyst, ovarian   . Anxiety   . High cholesterol     There are no active problems to display for this patient.   Past Surgical History  Procedure Laterality Date  . Cholecystectomy      Past Surgical History  Procedure Laterality Date  . Cholecystectomy      Current Outpatient Rx  Name  Route  Sig  Dispense  Refill  . aspirin 81 MG tablet   Oral   Take 81 mg by mouth daily.         Marland Kitchen ibuprofen (ADVIL,MOTRIN) 800 MG tablet   Oral   Take 800 mg by mouth every 8 (eight) hours as needed.           Allergies:  Shellfish allergy and Tylenol  Family History: No family history on file.  Social History: Social History  Substance Use Topics  . Smoking status: Current Every Day Smoker -- 1.00 packs/day    Types: Cigarettes  . Smokeless tobacco: None  . Alcohol Use: No     Review of Systems:   10 point review of systems was performed and was otherwise negative:  Constitutional: No fever Eyes: No visual disturbances ENT: No sore throat, ear pain Cardiac: No chest pain Respiratory: No shortness of breath, wheezing, or stridor Abdomen: Right middle to lower quadrant as where the pain is located by  the patient. She states she has some mild pain in the lower back. Endocrine: No weight loss, No night sweats Extremities: No peripheral edema, cyanosis Skin: No rashes, easy bruising Neurologic: No focal weakness, trouble with speech or swollowing Urologic: No dysuria, Hematuria, or urinary frequency   Physical Exam:  ED Triage Vitals  Enc Vitals Group     BP 01/07/16 1110 138/83 mmHg     Pulse Rate 01/07/16 1110 79     Resp 01/07/16 1110 18     Temp 01/07/16 1110 98 F (36.7 C)     Temp Source 01/07/16 1110 Oral     SpO2 01/07/16 1110 100 %     Weight 01/07/16 1110 205 lb (92.987 kg)     Height 01/07/16 1110 5\' 6"  (1.676 m)     Head Cir --      Peak Flow --      Pain Score 01/07/16 1111 6     Pain Loc --      Pain Edu? --      Excl. in [place]? --     General: Awake , Alert , and Oriented times 3; GCS 15 Head: Normal cephalic , atraumatic Eyes: Pupils equal , round, reactive to light Nose/Throat: No nasal drainage, patent upper airway without erythema or exudate.  Neck: Supple, Full range of motion, No anterior adenopathy or palpable thyroid masses Lungs: Clear to  ascultation without wheezes , rhonchi, or rales Heart: Regular rate, regular rhythm without murmurs , gallops , or rubs Abdomen: Tender in the right lower quadrant to right middle quadrant without without rebound, guarding , or rigidity; bowel sounds positive and symmetric in all 4 quadrants. No organomegaly .        Extremities: 2 plus symmetric pulses. No edema, clubbing or cyanosis Neurologic: normal ambulation, Motor symmetric without deficits, sensory intact Skin: warm, dry, no rashes   Labs:   All laboratory work was reviewed including any pertinent negatives or positives listed below:  Labs Reviewed  COMPREHENSIVE METABOLIC PANEL - Abnormal; Notable for the following:    Total Protein 8.2 (*)    ALT 13 (*)    All other components within normal limits  CBC - Abnormal; Notable for the following:    WBC  12.9 (*)    All other components within normal limits  URINALYSIS COMPLETEWITH MICROSCOPIC ([facility] ONLY) - Abnormal; Notable for the following:    Color, Urine YELLOW (*)    APPearance CLEAR (*)    Hgb urine dipstick 2+ (*)    Squamous Epithelial / LPF 0-5 (*)    All other components within normal limits  LIPASE, BLOOD  POCT PREGNANCY, URINE  POC URINE PREG, ED   reviewed the patient's laboratory work shows an elevated white blood cell count.   Radiology: *     EXAM: CT ABDOMEN AND PELVIS WITH CONTRAST  TECHNIQUE: Multidetector CT imaging of the abdomen and pelvis was performed using the standard protocol following bolus administration of intravenous contrast.  CONTRAST: 145mL OMNIPAQUE IOHEXOL 300 MG/ML SOLN  COMPARISON: None.  FINDINGS: Lower chest: Clear lung bases. Normal heart size.  Hepatobiliary: Normal liver. No hepatic mass. Prior cholecystectomy.  Pancreas: Normal.  Spleen: Normal.  Adrenals/Urinary Tract: Normal adrenal glands. Normal kidneys. No obstructive uropathy or urolithiasis. Normal bladder.  Stomach/Bowel: No bowel wall thickening or dilatation. Normal appendix. No pneumatosis, pneumoperitoneum or portal venous gas. Moderate amount of stool in the ascending colon. Small amount of pelvic free fluid.  Vascular/Lymphatic: Normal caliber abdominal aorta. No lymphadenopathy.  Reproductive: Normal uterus. No adnexal mass.  Other: No fluid collection or hematoma.  Musculoskeletal: No lytic or sclerotic osseous lesion. No acute osseous abnormality.  IMPRESSION: 1. No acute abdominal or pelvic pathology. 2. Normal appendix.   Electronically Signed   I personally reviewed the radiologic studies     ED Course: Differential diagnosis includes but is not exclusive to ovarian cyst, ovarian torsion, acute appendicitis, urinary tract infection, endometriosis, bowel obstruction, colitis, renal colic, gastroenteritis, etc. Given the  patient's current presentation and objective findings I was not sure of the exact nature of her pain. CAT scan with contrast did not show any surgical findings at this time. Patient was advised to continue with follow-up with her primary physician. If she develops a fever, bloody stool, persistent vomiting, or any other new concerns to return here to emergency department. His given a prescription for Bentyl for crampy abdominal discomfort.    Assessment:  Acute unspecified right lower quadrant abdominal pain in female     Plan:  Outpatient management Patient was advised to return immediately if condition worsens. Patient was advised to follow up with their primary care physician or other specialized physicians involved in their outpatient care             Daymon Larsen, MD 01/07/16 1626

## 2016-01-07 NOTE — ED Notes (Signed)
Pt began having rt lower abd pain last night without fever, went to pcp and was sent for possible appendicitis. Pt denies fever. Reports nausea, vomited x 2 last night. Denies dysuria.

## 2016-07-18 ENCOUNTER — Emergency Department
Admission: EM | Admit: 2016-07-18 | Discharge: 2016-07-18 | Disposition: A | Discharge status: Home / Self Care | Payer: No Typology Code available for payment source | Attending: Emergency Medicine | Admitting: Emergency Medicine

## 2016-07-18 ENCOUNTER — Emergency Department: Payer: No Typology Code available for payment source

## 2016-07-18 ENCOUNTER — Encounter: Payer: Self-pay | Admitting: Emergency Medicine

## 2016-07-18 DIAGNOSIS — M25562 Pain in left knee: Secondary | ICD-10-CM | POA: Diagnosis present

## 2016-07-18 DIAGNOSIS — F17210 Nicotine dependence, cigarettes, uncomplicated: Secondary | ICD-10-CM | POA: Diagnosis not present

## 2016-07-18 DIAGNOSIS — Y92410 Unspecified street and highway as the place of occurrence of the external cause: Secondary | ICD-10-CM | POA: Insufficient documentation

## 2016-07-18 DIAGNOSIS — Z7982 Long term (current) use of aspirin: Secondary | ICD-10-CM | POA: Insufficient documentation

## 2016-07-18 DIAGNOSIS — S8002XA Contusion of left knee, initial encounter: Secondary | ICD-10-CM | POA: Diagnosis not present

## 2016-07-18 DIAGNOSIS — Y9389 Activity, other specified: Secondary | ICD-10-CM | POA: Diagnosis not present

## 2016-07-18 DIAGNOSIS — Y999 Unspecified external cause status: Secondary | ICD-10-CM | POA: Insufficient documentation

## 2016-07-18 MED ORDER — CYCLOBENZAPRINE HCL 10 MG PO TABS: 10.0000 mg | ORAL_TABLET | Freq: Three times a day (TID) | ORAL | 0 refills | Status: DC | PRN

## 2016-07-18 MED ORDER — IBUPROFEN 800 MG PO TABS: 800.0000 mg | ORAL_TABLET | Freq: Three times a day (TID) | ORAL | 0 refills | Status: DC | PRN

## 2016-07-18 NOTE — ED Notes (Signed)
Pt discharged home after verbalizing understanding of discharge instructions; nad noted. 
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
## 2016-11-15 ENCOUNTER — Encounter: Payer: Self-pay | Admitting: Emergency Medicine

## 2016-11-15 DIAGNOSIS — Z791 Long term (current) use of non-steroidal anti-inflammatories (NSAID): Secondary | ICD-10-CM | POA: Diagnosis not present

## 2016-11-15 DIAGNOSIS — Z7982 Long term (current) use of aspirin: Secondary | ICD-10-CM | POA: Diagnosis not present

## 2016-11-15 DIAGNOSIS — F17210 Nicotine dependence, cigarettes, uncomplicated: Secondary | ICD-10-CM | POA: Diagnosis not present

## 2016-11-15 DIAGNOSIS — R079 Chest pain, unspecified: Secondary | ICD-10-CM | POA: Diagnosis not present

## 2016-11-15 DIAGNOSIS — F419 Anxiety disorder, unspecified: Secondary | ICD-10-CM | POA: Insufficient documentation

## 2016-11-15 DIAGNOSIS — R51 Headache: Secondary | ICD-10-CM | POA: Insufficient documentation

## 2016-11-15 DIAGNOSIS — R0789 Other chest pain: Secondary | ICD-10-CM | POA: Diagnosis present

## 2016-11-15 LAB — CBC
HCT: 39.6 % (ref 35.0–47.0)
Hemoglobin: 13.7 g/dL (ref 12.0–16.0)
MCH: 29.4 pg (ref 26.0–34.0)
MCHC: 34.7 g/dL (ref 32.0–36.0)
MCV: 84.7 fL (ref 80.0–100.0)
PLATELETS: 293 10*3/uL (ref 150–440)
RBC: 4.68 MIL/uL (ref 3.80–5.20)
RDW: 14 % (ref 11.5–14.5)
WBC: 11.8 10*3/uL — AB (ref 3.6–11.0)

## 2016-11-15 LAB — BASIC METABOLIC PANEL
ANION GAP: 8 (ref 5–15)
BUN: 7 mg/dL (ref 6–20)
CALCIUM: 9 mg/dL (ref 8.9–10.3)
CO2: 21 mmol/L — ABNORMAL LOW (ref 22–32)
Chloride: 110 mmol/L (ref 101–111)
Creatinine, Ser: 0.93 mg/dL (ref 0.44–1.00)
GFR calc Af Amer: >60 mL/min (ref >60)
GLUCOSE: 137 mg/dL — AB (ref 65–99)
Potassium: 3.2 mmol/L — ABNORMAL LOW (ref 3.5–5.1)
Sodium: 139 mmol/L (ref 135–145)

## 2016-11-15 LAB — TROPONIN I: Troponin I: <0.03 ng/mL (ref <0.03)

## 2016-11-15 NOTE — ED Triage Notes (Signed)
Pt in with co chest pain x 1 hr no cardiac disease has been diagnosed with chest wall pain. Has had some shob but none at present, does have a hx of panic attacks and felt like she had one earlier.

## 2016-11-16 ENCOUNTER — Emergency Department: Payer: BLUE CROSS/BLUE SHIELD

## 2016-11-16 ENCOUNTER — Emergency Department
Admission: EM | Admit: 2016-11-16 | Discharge: 2016-11-16 | Disposition: A | Discharge status: Home / Self Care | Payer: BLUE CROSS/BLUE SHIELD | Attending: Emergency Medicine | Admitting: Emergency Medicine

## 2016-11-16 DIAGNOSIS — R079 Chest pain, unspecified: Secondary | ICD-10-CM

## 2016-11-16 DIAGNOSIS — F419 Anxiety disorder, unspecified: Secondary | ICD-10-CM

## 2016-11-16 LAB — TROPONIN I: Troponin I: <0.03 ng/mL (ref <0.03)

## 2016-11-16 MED ORDER — GI COCKTAIL ~~LOC~~
30.0000 mL | Freq: Once | ORAL | Status: AC
  Administered 2016-11-16: 30 mL via ORAL
  Filled 2016-11-16: qty 30

## 2016-11-16 MED ORDER — HYDROXYZINE HCL 25 MG PO TABS
25.0000 mg | ORAL_TABLET | Freq: Once | ORAL | Status: AC
  Administered 2016-11-16: 25 mg via ORAL
  Filled 2016-11-16: qty 1

## 2016-11-16 MED ORDER — ASPIRIN 81 MG PO CHEW
324.0000 mg | CHEWABLE_TABLET | Freq: Once | ORAL | Status: AC
  Administered 2016-11-16: 324 mg via ORAL
  Filled 2016-11-16: qty 4

## 2016-11-16 MED ORDER — HYDROXYZINE PAMOATE 25 MG PO CAPS: 25.0000 mg | ORAL_CAPSULE | Freq: Three times a day (TID) | ORAL | 0 refills | Status: DC | PRN

## 2016-11-16 MED ORDER — TRAMADOL HCL 50 MG PO TABS
50.0000 mg | ORAL_TABLET | Freq: Once | ORAL | Status: AC
  Administered 2016-11-16: 50 mg via ORAL
  Filled 2016-11-16: qty 1

## 2016-11-16 NOTE — ED Provider Notes (Signed)
[facility] Emergency Department Provider Note   ____________________________________________   First MD Initiated Contact with Patient 11/16/16 0036     (approximate)  I have reviewed the triage vital signs and the nursing notes.   HISTORY  Chief Complaint Chest Pain    HPI Robin Saunders is a 35 y.o. female who comes into the hospital today with chest pain. The patient reports that earlier today she was having an anxiety attack. She reports it kept getting worse so she went home. She was laying down which are having chest pain around 9 PM. She reports that it initially was sharp pain but now she is feeling pressure. She reports that the pain was worse when she was walking. She has had this before but has been a while. The patient rates her pain 8 out of 10 in intensity. She denies any shortness of breath, dizziness. The patient has some nausea with no vomiting and reports she does have a mild headache. She reports that she has been eating anything today. The patient denies any abdominal pain but says she feels achy. The patient is here for evaluation today.   Past Medical History:  Diagnosis Date  . Anxiety   . Cyst, ovarian   . High cholesterol     There are no active problems to display for this patient.   Past Surgical History:  Procedure Laterality Date  . CHOLECYSTECTOMY      Prior to Admission medications   Medication Sig Start Date End Date Taking? Authorizing Provider  aspirin 81 MG tablet Take 81 mg by mouth daily.    Historical Provider, MD  cyclobenzaprine (FLEXERIL) 10 MG tablet Take 1 tablet (10 mg total) by mouth 3 (three) times daily as needed for muscle spasms. 07/18/16   Pierce Crane Beers, PA-C  dicyclomine (BENTYL) 20 MG tablet Take 1 tablet (20 mg total) by mouth 3 (three) times daily as needed for spasms. 01/07/16   Daymon Larsen, MD  hydrOXYzine (VISTARIL) 25 MG capsule Take 1 capsule (25 mg total) by mouth 3 (three) times  daily as needed for anxiety. 11/16/16   Loney Hering, MD  ibuprofen (ADVIL,MOTRIN) 800 MG tablet Take 1 tablet (800 mg total) by mouth every 8 (eight) hours as needed. 07/18/16   Arlyss Repress, PA-C    Allergies Shellfish allergy and Tylenol [acetaminophen]  No family history on file.  Social History Social History  Substance Use Topics  . Smoking status: Current Every Day Smoker    Packs/day: 1.00    Types: Cigarettes  . Smokeless tobacco: Never Used  . Alcohol use No    Review of Systems Constitutional: No fever/chills Eyes: No visual changes. ENT: No sore throat. Cardiovascular: chest pain. Respiratory: Denies shortness of breath. Gastrointestinal: No abdominal pain.  No nausea, no vomiting.  No diarrhea.  No constipation. Genitourinary: Negative for dysuria. Musculoskeletal: Negative for back pain. Skin: Negative for rash. Neurological:  headaches  10-point ROS otherwise negative.  ____________________________________________   PHYSICAL EXAM:  VITAL SIGNS: ED Triage Vitals  Enc Vitals Group     BP 11/15/16 2233 140/80     Pulse Rate 11/15/16 2233 81     Resp 11/15/16 2233 18     Temp 11/15/16 2233 98.4 F (36.9 C)     Temp Source 11/15/16 2233 Oral     SpO2 11/15/16 2233 99 %     Weight 11/15/16 2229 189 lb (85.7 kg)     Height 11/15/16 2229  5\' 6"  (1.676 m)     Head Circumference --      Peak Flow --      Pain Score 11/15/16 2229 7     Pain Loc --      Pain Edu? --      Excl. in [place]? --     Constitutional: Alert and oriented. Well appearing and in mild distress. Eyes: Conjunctivae are normal. PERRL. EOMI. Head: Atraumatic. Nose: No congestion/rhinnorhea. Mouth/Throat: Mucous membranes are moist.  Oropharynx non-erythematous. Cardiovascular: Normal rate, regular rhythm. Grossly normal heart sounds.  Good peripheral circulation. Respiratory: Normal respiratory effort.  No retractions. Lungs CTAB. Gastrointestinal: Soft and nontender. No  distention. Positive bowel sounds Musculoskeletal: No lower extremity tenderness nor edema.   Neurologic:  Normal speech and language.  Skin:  Skin is warm, dry and intact.  Psychiatric: Mood and affect are normal.  ____________________________________________   LABS (all labs ordered are listed, but only abnormal results are displayed)  Labs Reviewed  CBC - Abnormal; Notable for the following:       Result Value   WBC 11.8 (*)    All other components within normal limits  BASIC METABOLIC PANEL - Abnormal; Notable for the following:    Potassium 3.2 (*)    CO2 21 (*)    Glucose, Bld 137 (*)    All other components within normal limits  TROPONIN I  TROPONIN I   ____________________________________________  EKG  ED ECG REPORT I, Loney Hering, the attending physician, personally viewed and interpreted this ECG.   Date: 11/15/2016  EKG Time: 2232  Rate: 20  Rhythm: normal sinus rhythm  Axis: normal  Intervals:none  ST&T Change: none  ____________________________________________  RADIOLOGY  CXR ____________________________________________   PROCEDURES  Procedure(s) performed: None  Procedures  Critical Care performed: No  ____________________________________________   INITIAL IMPRESSION / ASSESSMENT AND PLAN / ED COURSE  Pertinent labs & imaging results that were available during my care of the patient were reviewed by me and considered in my medical decision making (see chart for details).  This is a 35 year old female who comes into the hospital today with some chest pain. She reports that it started after having an anxiety attack. I will give the patient a dose of aspirin as well as a GI cocktail and I will give the patient some tramadol. She does have some tenderness to palpation of her chest as well. I will reassess the patient's troponin as the initial troponin is negative. I will send the patient for chest x-ray.  Clinical Course as of Nov 16 228  Wed Nov 16, 2016  [id number] No active cardiopulmonary disease. DG Chest 2 View [AW]    Clinical Course User Index [AW] Loney Hering, MD   The patient's pain is improved. The patient's troponin is negative as is her chest x-ray. I will discharge the patient home and also give her a dose of hydroxyzine. I will encourage the patient to follow-up with her primary care physician for further evaluation of this chest pain.  ____________________________________________   FINAL CLINICAL IMPRESSION(S) / ED DIAGNOSES  Final diagnoses:  Chest pain, unspecified type  Anxiety      NEW MEDICATIONS STARTED DURING THIS VISIT:  New Prescriptions   HYDROXYZINE (VISTARIL) 25 MG CAPSULE    Take 1 capsule (25 mg total) by mouth 3 (three) times daily as needed for anxiety.     Note:  This document was prepared using Systems analyst and may include  unintentional dictation errors.    Loney Hering, MD 11/16/16 [phone]

## 2016-11-16 NOTE — ED Notes (Signed)

## 2016-11-16 NOTE — ED Notes (Signed)
ED Provider at bedside. 

## 2016-12-26 ENCOUNTER — Encounter: Payer: Self-pay | Admitting: Emergency Medicine

## 2016-12-26 ENCOUNTER — Emergency Department
Admission: EM | Admit: 2016-12-26 | Discharge: 2016-12-27 | Disposition: A | Discharge status: Home / Self Care | Payer: BLUE CROSS/BLUE SHIELD | Attending: Emergency Medicine | Admitting: Emergency Medicine

## 2016-12-26 DIAGNOSIS — R4586 Emotional lability: Secondary | ICD-10-CM | POA: Insufficient documentation

## 2016-12-26 DIAGNOSIS — F32A Depression, unspecified: Secondary | ICD-10-CM

## 2016-12-26 DIAGNOSIS — F329 Major depressive disorder, single episode, unspecified: Secondary | ICD-10-CM | POA: Diagnosis not present

## 2016-12-26 DIAGNOSIS — Z79899 Other long term (current) drug therapy: Secondary | ICD-10-CM | POA: Diagnosis not present

## 2016-12-26 DIAGNOSIS — F17210 Nicotine dependence, cigarettes, uncomplicated: Secondary | ICD-10-CM | POA: Diagnosis not present

## 2016-12-26 DIAGNOSIS — Z7982 Long term (current) use of aspirin: Secondary | ICD-10-CM | POA: Insufficient documentation

## 2016-12-26 DIAGNOSIS — Z046 Encounter for general psychiatric examination, requested by authority: Secondary | ICD-10-CM | POA: Diagnosis present

## 2016-12-26 LAB — URINE DRUG SCREEN, QUALITATIVE (ARMC ONLY)
Amphetamines, Ur Screen: NOT DETECTED
Barbiturates, Ur Screen: NOT DETECTED
Benzodiazepine, Ur Scrn: NOT DETECTED
Cannabinoid 50 Ng, Ur ~~LOC~~: NOT DETECTED
Cocaine Metabolite,Ur ~~LOC~~: NOT DETECTED
MDMA (ECSTASY) UR SCREEN: NOT DETECTED
METHADONE SCREEN, URINE: NOT DETECTED
Opiate, Ur Screen: NOT DETECTED
Phencyclidine (PCP) Ur S: NOT DETECTED
TRICYCLIC, UR SCREEN: NOT DETECTED

## 2016-12-26 LAB — COMPREHENSIVE METABOLIC PANEL
ALBUMIN: 5 g/dL (ref 3.5–5.0)
ALT: 15 U/L (ref 14–54)
AST: 21 U/L (ref 15–41)
Alkaline Phosphatase: 55 U/L (ref 38–126)
Anion gap: 9 (ref 5–15)
BUN: 6 mg/dL (ref 6–20)
CHLORIDE: 99 mmol/L — AB (ref 101–111)
CO2: 25 mmol/L (ref 22–32)
Calcium: 9.2 mg/dL (ref 8.9–10.3)
Creatinine, Ser: 0.79 mg/dL (ref 0.44–1.00)
GFR calc non Af Amer: >60 mL/min (ref >60)
Glucose, Bld: 86 mg/dL (ref 65–99)
Potassium: 2.8 mmol/L — ABNORMAL LOW (ref 3.5–5.1)
SODIUM: 133 mmol/L — AB (ref 135–145)
Total Bilirubin: 0.8 mg/dL (ref 0.3–1.2)
Total Protein: 9 g/dL — ABNORMAL HIGH (ref 6.5–8.1)

## 2016-12-26 LAB — CBC
HCT: 43.8 % (ref 35.0–47.0)
HEMOGLOBIN: 15 g/dL (ref 12.0–16.0)
MCH: 28.7 pg (ref 26.0–34.0)
MCHC: 34.3 g/dL (ref 32.0–36.0)
MCV: 83.8 fL (ref 80.0–100.0)
Platelets: 351 10*3/uL (ref 150–440)
RBC: 5.22 MIL/uL — AB (ref 3.80–5.20)
RDW: 14.1 % (ref 11.5–14.5)
WBC: 11.9 10*3/uL — ABNORMAL HIGH (ref 3.6–11.0)

## 2016-12-26 LAB — ACETAMINOPHEN LEVEL

## 2016-12-26 LAB — ETHANOL: Alcohol, Ethyl (B): <5 mg/dL (ref <5)

## 2016-12-26 LAB — SALICYLATE LEVEL

## 2016-12-26 NOTE — ED Provider Notes (Signed)
[facility] Emergency Department Provider Note  ____________________________________________   I have reviewed the triage vital signs and the nursing notes.   HISTORY  Chief Complaint Psychiatric Evaluation   History limited by: Not Limited   HPI Robin Saunders is a 36 y.o. female who presents to the emergency department today because of concerns for 2 weeks of change in her motions and behavior. The patient was seen in the emergency department roughly 2 months ago. She states that she was started on medications for anxiety attacks panic attacks. 2 weeks ago her Zoloft was doubled. Since that time she has found that she becomes easily angered. Last night she did punch through a wall. She also states is she had brief thoughts of wanting hurt herself last night. Denies any current SI. He is here because she is concerned that the medication is causing her emotional change. She denies any recent illness.   Past Medical History:  Diagnosis Date  . Anxiety   . Cyst, ovarian   . High cholesterol     There are no active problems to display for this patient.   Past Surgical History:  Procedure Laterality Date  . CHOLECYSTECTOMY      Prior to Admission medications   Medication Sig Start Date End Date Taking? Authorizing Provider  aspirin 81 MG tablet Take 81 mg by mouth daily.    Historical Provider, MD  cyclobenzaprine (FLEXERIL) 10 MG tablet Take 1 tablet (10 mg total) by mouth 3 (three) times daily as needed for muscle spasms. 07/18/16   Pierce Crane Beers, PA-C  dicyclomine (BENTYL) 20 MG tablet Take 1 tablet (20 mg total) by mouth 3 (three) times daily as needed for spasms. 01/07/16   Daymon Larsen, MD  hydrOXYzine (VISTARIL) 25 MG capsule Take 1 capsule (25 mg total) by mouth 3 (three) times daily as needed for anxiety. 11/16/16   Loney Hering, MD  ibuprofen (ADVIL,MOTRIN) 800 MG tablet Take 1 tablet (800 mg total) by mouth every 8 (eight) hours as  needed. 07/18/16   Arlyss Repress, PA-C    Allergies Shellfish allergy and Tylenol [acetaminophen]  No family history on file.  Social History Social History  Substance Use Topics  . Smoking status: Current Every Day Smoker    Packs/day: 1.00    Types: Cigarettes  . Smokeless tobacco: Never Used  . Alcohol use No    Review of Systems  Constitutional: Negative for fever. Cardiovascular: Negative for chest pain. Respiratory: Negative for shortness of breath. Gastrointestinal: Negative for abdominal pain, vomiting and diarrhea. Neurological: Negative for headaches, focal weakness or numbness. Psychiatric: Easily upset 10-point ROS otherwise negative.  ____________________________________________   PHYSICAL EXAM:  VITAL SIGNS: ED Triage Vitals  Enc Vitals Group     BP 12/26/16 2022 (!) 148/83     Pulse Rate 12/26/16 2022 75     Resp 12/26/16 2022 18     Temp 12/26/16 2022 98.8 F (37.1 C)     Temp Source 12/26/16 2022 Oral     SpO2 12/26/16 2022 100 %     Weight 12/26/16 2022 191 lb (86.6 kg)     Height 12/26/16 2022 5\' 6"  (1.676 m)     Head Circumference --      Peak Flow --      Pain Score 12/26/16 2028 5    Constitutional: Alert and oriented. Well appearing and in no distress. Eyes: Conjunctivae are normal. Normal extraocular movements. ENT   Head: Normocephalic and  atraumatic.   Nose: No congestion/rhinnorhea.   Mouth/Throat: Mucous membranes are moist.   Neck: No stridor. Hematological/Lymphatic/Immunilogical: No cervical lymphadenopathy. Cardiovascular: Normal rate, regular rhythm.  No murmurs, rubs, or gallops.  Respiratory: Normal respiratory effort without tachypnea nor retractions. Breath sounds are clear and equal bilaterally. No wheezes/rales/rhonchi. Gastrointestinal: Soft and non tender. No rebound. No guarding.  Genitourinary: Deferred Musculoskeletal: Normal range of motion in all extremities. No lower extremity edema. Neurologic:   Normal speech and language. No gross focal neurologic deficits are appreciated.  Skin:  Skin is warm, dry and intact. No rash noted. Psychiatric: Mood and affect are normal. Speech and behavior are normal. Patient exhibits appropriate insight and judgment.  ____________________________________________    LABS (pertinent positives/negatives)  Labs Reviewed  COMPREHENSIVE METABOLIC PANEL - Abnormal; Notable for the following:       Result Value   Sodium 133 (*)    Potassium 2.8 (*)    Chloride 99 (*)    Total Protein 9.0 (*)    All other components within normal limits  ACETAMINOPHEN LEVEL - Abnormal; Notable for the following:    Acetaminophen (Tylenol), Serum <10 (*)    All other components within normal limits  CBC - Abnormal; Notable for the following:    WBC 11.9 (*)    RBC 5.22 (*)    All other components within normal limits  ETHANOL  SALICYLATE LEVEL  URINE DRUG SCREEN, QUALITATIVE ([facility] ONLY)   ____________________________________________   EKG  None  ____________________________________________    RADIOLOGY  None  ____________________________________________   PROCEDURES  Procedures  ____________________________________________   INITIAL IMPRESSION / ASSESSMENT AND PLAN / ED COURSE  Pertinent labs & imaging results that were available during my care of the patient were reviewed by me and considered in my medical decision making (see chart for details).  Patient presented to the emergency department today because of concerns for some emotional change recently. This could likely be related to medications that she was put on. Will have specialist on-call by weight the patient. She denies any current signs of swelling herself or others. I do not think she warrants IVC paperwork at this time.  ____________________________________________   FINAL CLINICAL IMPRESSION(S) / ED DIAGNOSES  Emotionally labile  Note: This dictation was prepared with  Dragon dictation. Any transcriptional errors that result from this process are unintentional     Nance Pear, MD 12/26/16 2314

## 2016-12-26 NOTE — ED Notes (Signed)
Pt offered snack tray and drink. Pt refused both but this RN placed a cup of water in her room.

## 2016-12-26 NOTE — ED Triage Notes (Signed)
Pt presents to ED with c/o having "mental side effects" to Zoloft and Buspirone. Pt reports has been taking medicine since 11/21/2016. Pt reports feeling depressed, irritable, crying on and off. Pt denies SI or HI. Pt denies AH or VH. Pt states last night she was upset and she punched the wall. Pt reports medications where originally prescribed for anxiety.

## 2016-12-27 MED ORDER — OLANZAPINE 2.5 MG PO TABS
1.2500 mg | ORAL_TABLET | Freq: Once | ORAL | Status: AC
  Administered 2016-12-27: 1.25 mg via ORAL
  Filled 2016-12-27: qty 0.5

## 2016-12-27 MED ORDER — OLANZAPINE 2.5 MG PO TABS: ORAL_TABLET | ORAL | 0 refills | Status: DC

## 2016-12-27 MED ORDER — FLUOXETINE HCL 20 MG PO CAPS
20.0000 mg | ORAL_CAPSULE | Freq: Once | ORAL | Status: AC
  Administered 2016-12-27: 20 mg via ORAL
  Filled 2016-12-27: qty 1

## 2016-12-27 MED ORDER — FLUOXETINE HCL 20 MG PO TABS: 20.0000 mg | ORAL_TABLET | Freq: Every day | ORAL | 0 refills | Status: DC

## 2016-12-27 MED ORDER — OLANZAPINE 5 MG PO TABS
ORAL_TABLET | ORAL | Status: AC
  Administered 2016-12-27: 1.25 mg via ORAL
  Filled 2016-12-27: qty 1

## 2016-12-27 NOTE — Discharge Instructions (Signed)
1. Discontinue current medications. Start the following medications: Zyprexa 2.5 mg - 1/2 tablet at night Fluoxetine 20 mg - 1 capsule at night 2. Return to the ER for worsening symptoms, feelings of hurting yourself or others, or other concerns.

## 2016-12-27 NOTE — ED Notes (Signed)
SOC in process by telepsych

## 2017-01-16 IMAGING — CT CT HEAD W/O CM
1 series · 16 of 30 positions shown, 20 images · non-contrast
Comparison: None.

CLINICAL DATA: Acute onset of frontal headache and blurred vision.
Initial encounter.

EXAM:
CT HEAD WITHOUT CONTRAST
TECHNIQUE: Contiguous axial images were obtained from the base of the skull
through the vertex without intravenous contrast.

[Series 2: head wo · axial · 0.43mm/px · z∈[-248,-122]mm · 16 of 32 slices shown, 20 images]
[im 2/32  brain]
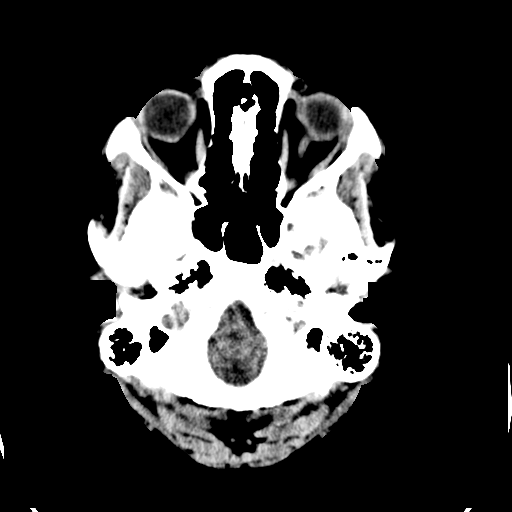
[im 2/32  bone]
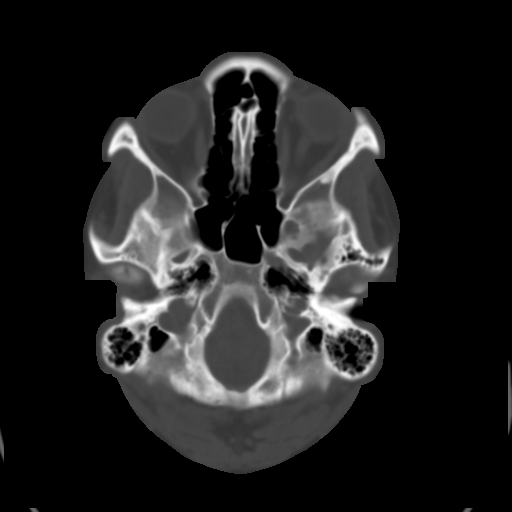
[im 4/32  brain]
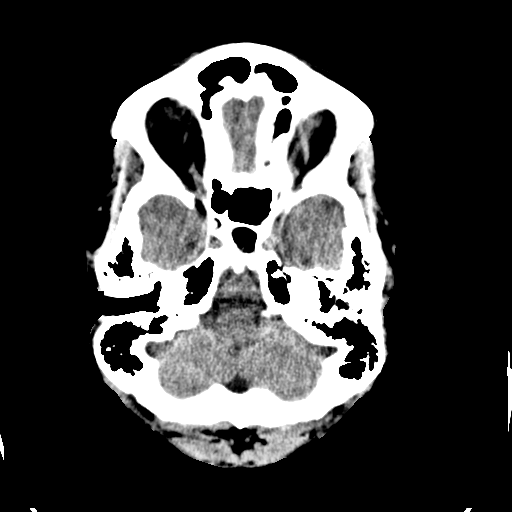
[im 6/32  brain]
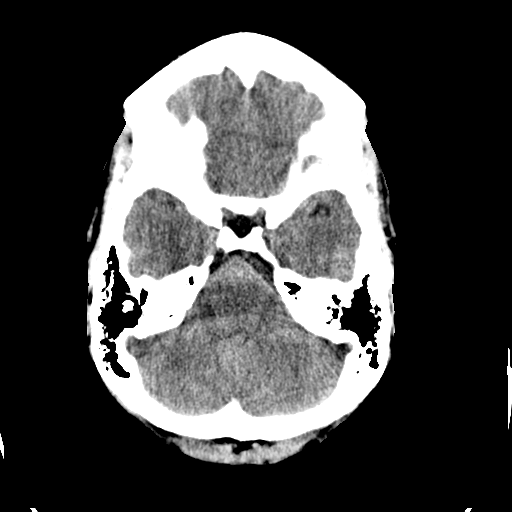
[im 8/32  brain]
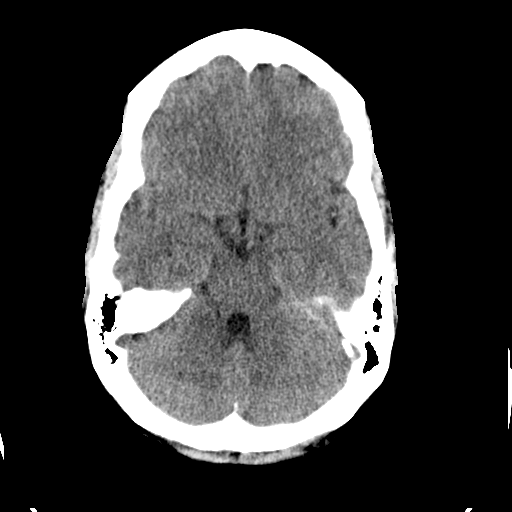
[im 9/32  brain]
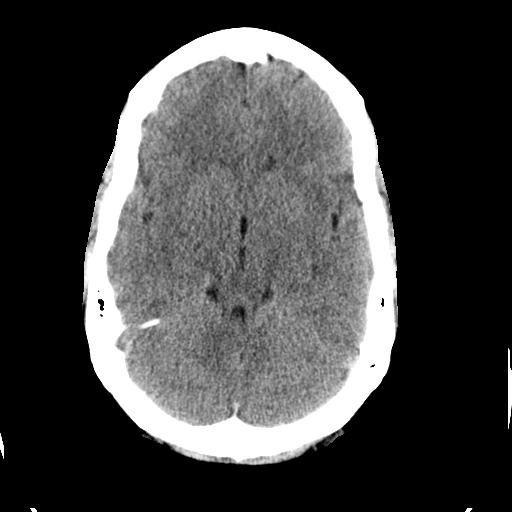
[im 9/32  bone]
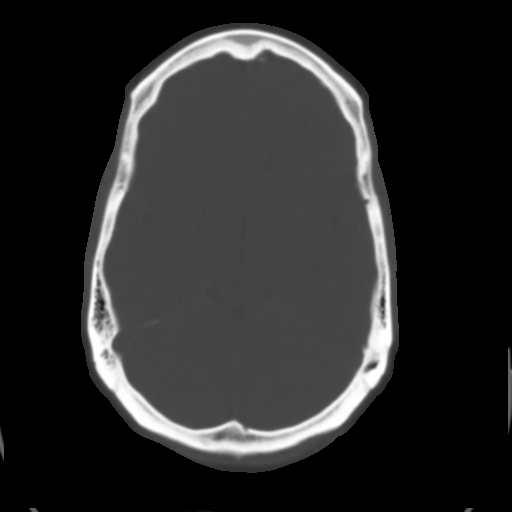
[im 11/32  brain]
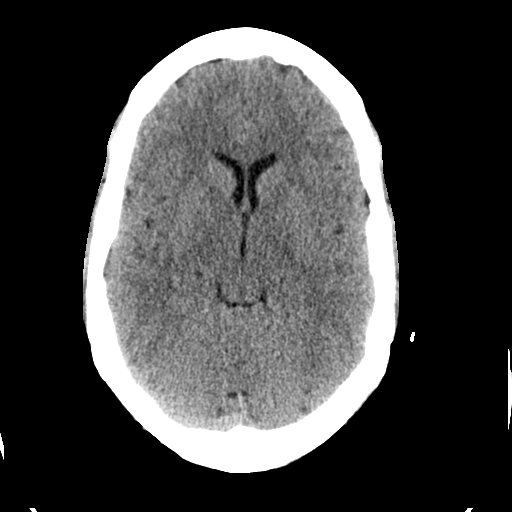
[im 13/32  brain]
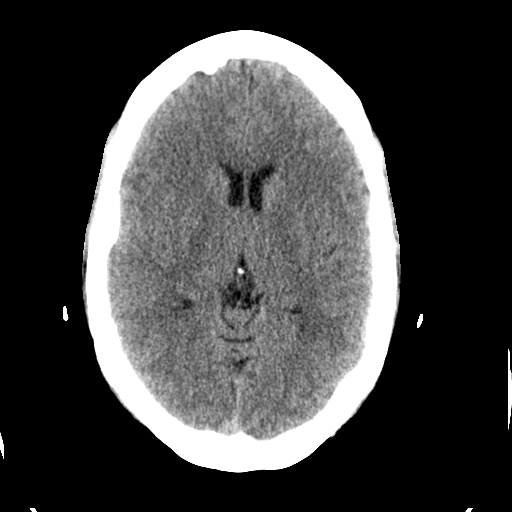
[im 15/32  brain]
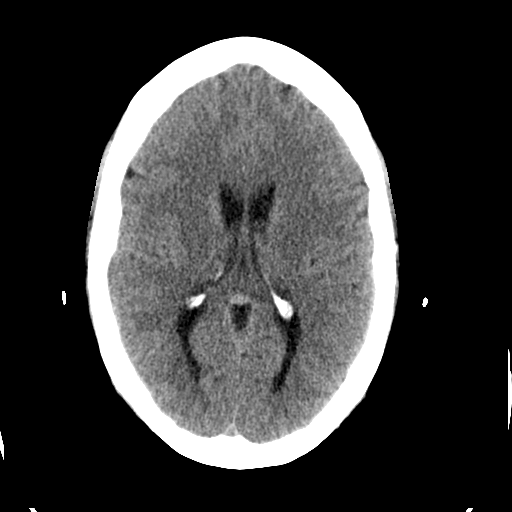
[im 17/32  brain]
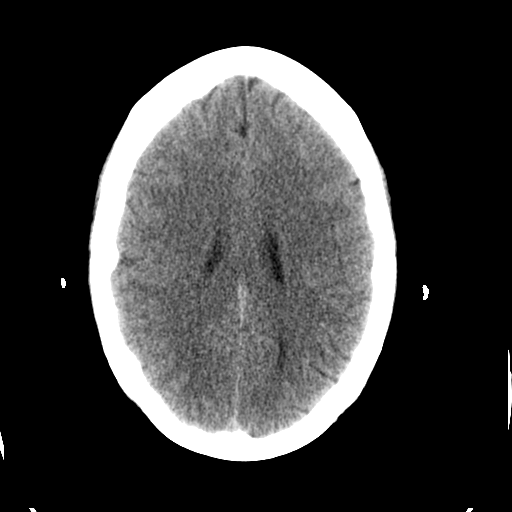
[im 17/32  bone]
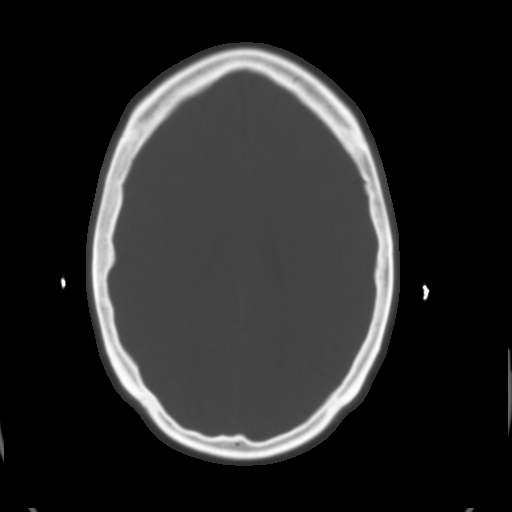
[im 19/32  brain]
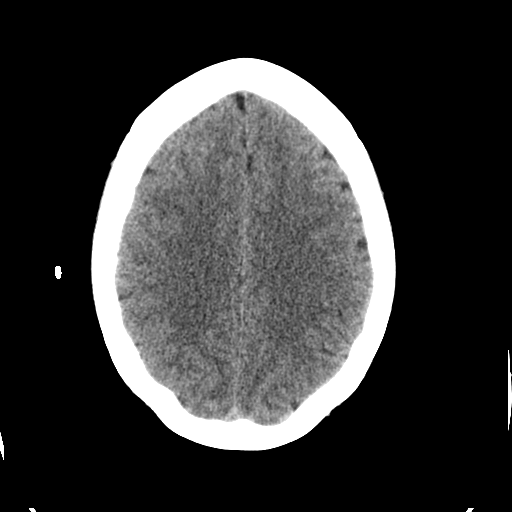
[im 21/32  brain]
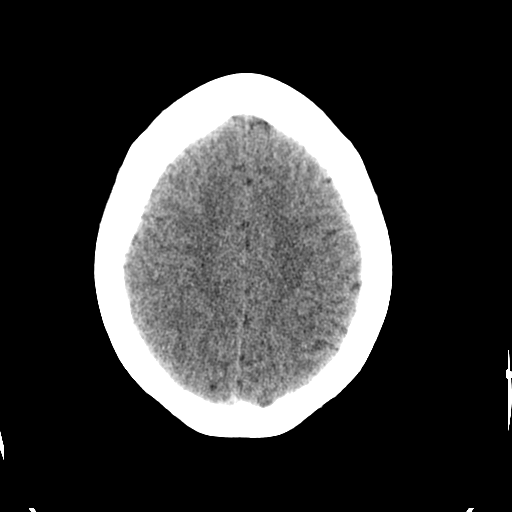
[im 23/32  brain]
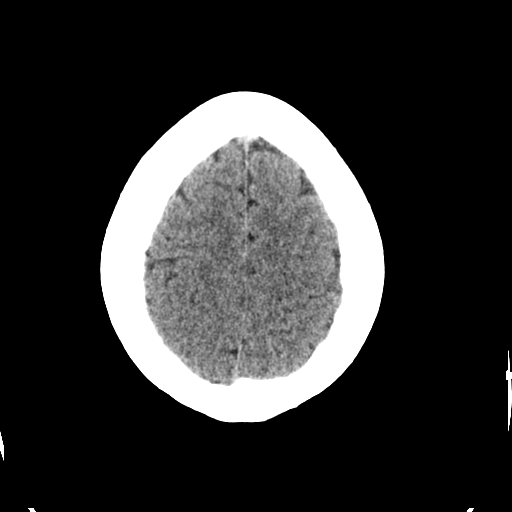
[im 24/32  brain]
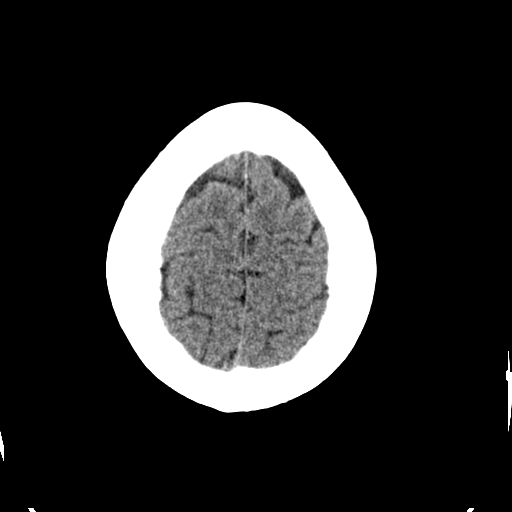
[im 24/32  bone]
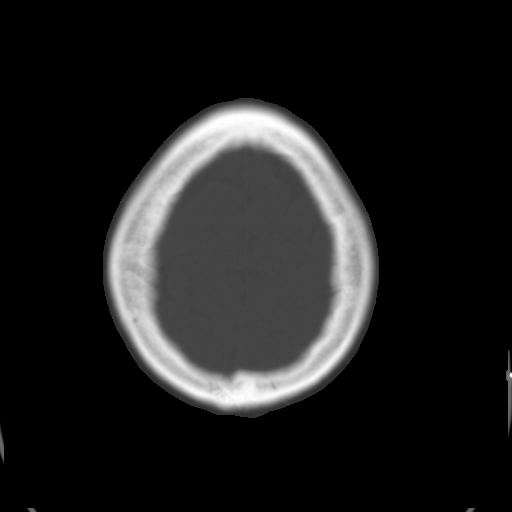
[im 26/32  brain]
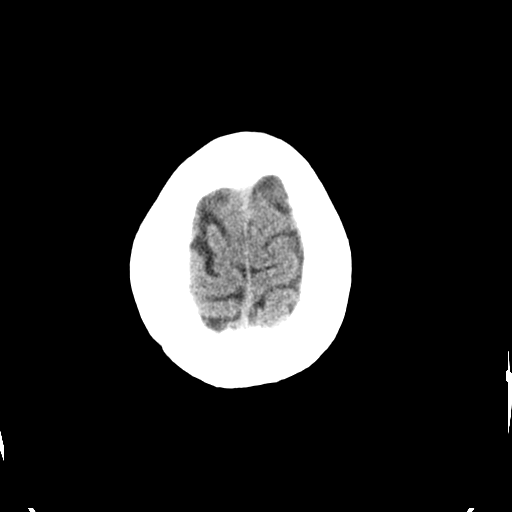
[im 28/32  brain]
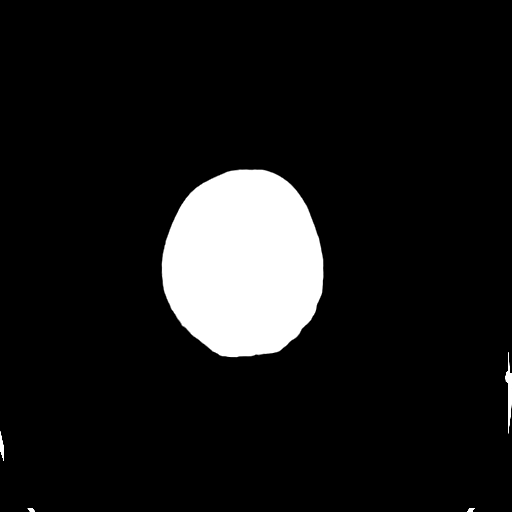
[im 30/32  brain]
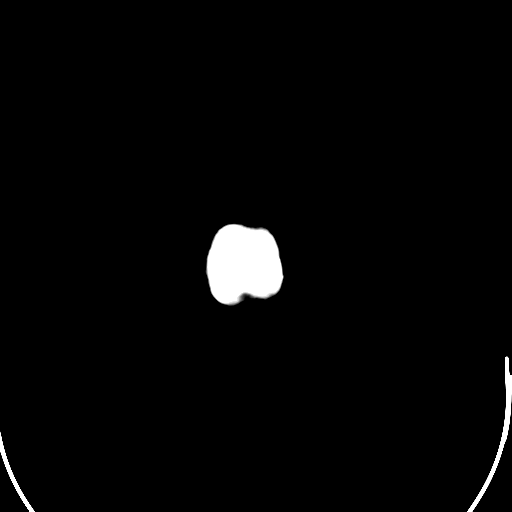

[16 of 30 positions shown; findings below may reference images not displayed]

FINDINGS: There is no evidence of acute infarction, mass lesion, or intra- or
extra-axial hemorrhage on CT.

The posterior fossa, including the cerebellum, brainstem and fourth
ventricle, is within normal limits. The third and lateral
ventricles, and basal ganglia are unremarkable in appearance. The
cerebral hemispheres are symmetric in appearance, with normal
gray-white differentiation. No mass effect or midline shift is seen.

There is no evidence of fracture; visualized osseous structures are
unremarkable in appearance. The visualized portions of the orbits
are within normal limits. The paranasal sinuses and mastoid air
cells are well-aerated. No significant soft tissue abnormalities are
seen.
IMPRESSION: Unremarkable noncontrast CT of the head.

## 2017-03-13 ENCOUNTER — Emergency Department: Payer: BLUE CROSS/BLUE SHIELD

## 2017-03-13 ENCOUNTER — Emergency Department
Admission: EM | Admit: 2017-03-13 | Discharge: 2017-03-13 | Disposition: A | Discharge status: Home / Self Care | Payer: BLUE CROSS/BLUE SHIELD | Attending: Emergency Medicine | Admitting: Emergency Medicine

## 2017-03-13 ENCOUNTER — Encounter: Payer: Self-pay | Admitting: *Deleted

## 2017-03-13 DIAGNOSIS — Z79899 Other long term (current) drug therapy: Secondary | ICD-10-CM | POA: Insufficient documentation

## 2017-03-13 DIAGNOSIS — Z7982 Long term (current) use of aspirin: Secondary | ICD-10-CM | POA: Diagnosis not present

## 2017-03-13 DIAGNOSIS — F17210 Nicotine dependence, cigarettes, uncomplicated: Secondary | ICD-10-CM | POA: Insufficient documentation

## 2017-03-13 DIAGNOSIS — R0789 Other chest pain: Secondary | ICD-10-CM | POA: Insufficient documentation

## 2017-03-13 LAB — CBC
HCT: 41.5 % (ref 35.0–47.0)
Hemoglobin: 14.1 g/dL (ref 12.0–16.0)
MCH: 29.2 pg (ref 26.0–34.0)
MCHC: 34.1 g/dL (ref 32.0–36.0)
MCV: 85.6 fL (ref 80.0–100.0)
PLATELETS: 336 10*3/uL (ref 150–440)
RBC: 4.84 MIL/uL (ref 3.80–5.20)
RDW: 14.2 % (ref 11.5–14.5)
WBC: 12.2 10*3/uL — ABNORMAL HIGH (ref 3.6–11.0)

## 2017-03-13 LAB — BASIC METABOLIC PANEL
Anion gap: 6 (ref 5–15)
BUN: 6 mg/dL (ref 6–20)
CALCIUM: 9.2 mg/dL (ref 8.9–10.3)
CO2: 25 mmol/L (ref 22–32)
CREATININE: 0.69 mg/dL (ref 0.44–1.00)
Chloride: 108 mmol/L (ref 101–111)
GFR calc Af Amer: >60 mL/min (ref >60)
GFR calc non Af Amer: >60 mL/min (ref >60)
GLUCOSE: 95 mg/dL (ref 65–99)
Potassium: 3.7 mmol/L (ref 3.5–5.1)
Sodium: 139 mmol/L (ref 135–145)

## 2017-03-13 LAB — TROPONIN I

## 2017-03-13 MED ORDER — NAPROXEN 500 MG PO TABS: 500.0000 mg | ORAL_TABLET | Freq: Two times a day (BID) | ORAL | 0 refills | Status: DC

## 2017-03-13 NOTE — ED Triage Notes (Signed)
States chest pain, right sided, that began this afternoon, states pain goes down her arm with a headache, denies any SOB, awake and alert

## 2017-03-13 NOTE — ED Provider Notes (Signed)
[facility] Emergency Department Provider Note  ____________________________________________  Time seen: Approximately 9:12 PM  I have reviewed the triage vital signs and the nursing notes.   HISTORY  Chief Complaint Chest Pain    HPI Robin Saunders is a 36 y.o. female who complains of right-sided chest pain that began this afternoon as she was just arriving for work. She works at [employer] as a Psychologist, counselling. She had not been doing anything strenuous and was off for the weekend recently and cannot think of anything that aggravated this pain. It is worse with movement of the arm. Not exertional, not pleuritic. No associated vomiting diaphoresis or shortness of breath. Not positional. Feels it as a burning and tingling into her right upper arm as well.     Past Medical History:  Diagnosis Date  . Anxiety   . Cyst, ovarian   . High cholesterol      There are no active problems to display for this patient.    Past Surgical History:  Procedure Laterality Date  . CHOLECYSTECTOMY       Prior to Admission medications   Medication Sig Start Date End Date Taking? Authorizing Provider  aspirin 81 MG tablet Take 81 mg by mouth daily.    Historical Provider, MD  busPIRone (BUSPAR) 15 MG tablet Take 1 tablet by mouth 2 (two) times daily. 12/21/16   Historical Provider, MD  clonazePAM (KLONOPIN) 0.5 MG tablet Take 1 tablet by mouth daily. 12/21/16   Historical Provider, MD  FLUoxetine (PROZAC) 20 MG tablet Take 1 tablet (20 mg total) by mouth at bedtime. 12/27/16   Paulette Blanch, MD  naproxen (NAPROSYN) 500 MG tablet Take 1 tablet (500 mg total) by mouth 2 (two) times daily with a meal. 03/13/17   Carrie Mew, MD  OLANZapine (ZYPREXA) 2.5 MG tablet 1/2 tablet every night 12/27/16   Paulette Blanch, MD  sertraline (ZOLOFT) 25 MG tablet Take 2 tablets by mouth at bedtime.  12/21/16   Historical Provider, MD  sertraline (ZOLOFT) 50 MG tablet Take 1 tablet by  mouth daily. 12/21/16   Historical Provider, MD     Allergies Shellfish allergy and Tylenol [acetaminophen]   History reviewed. No pertinent family history.  Social History Social History  Substance Use Topics  . Smoking status: Current Every Day Smoker    Packs/day: 1.00    Types: Cigarettes  . Smokeless tobacco: Never Used  . Alcohol use No    Review of Systems  Constitutional:   No fever or chills.  ENT:   No sore throat. No rhinorrhea. Cardiovascular:   Positive as above chest pain. Respiratory:   No dyspnea or cough. Gastrointestinal:   Negative for abdominal pain, vomiting and diarrhea.  Genitourinary:   Negative for dysuria or difficulty urinating. Musculoskeletal:   Negative for focal pain or swelling Neurological:   Negative for headaches 10-point ROS otherwise negative.  ____________________________________________   PHYSICAL EXAM:  VITAL SIGNS: ED Triage Vitals  Enc Vitals Group     BP 03/13/17 1640 (!) 144/85     Pulse Rate 03/13/17 1640 91     Resp 03/13/17 1640 18     Temp 03/13/17 1640 98.6 F (37 C)     Temp Source 03/13/17 1640 Oral     SpO2 03/13/17 1640 100 %     Weight 03/13/17 1641 215 lb (97.5 kg)     Height 03/13/17 1641 5\' 6"  (1.676 m)     Head Circumference --  Peak Flow --      Pain Score 03/13/17 1645 7     Pain Loc --      Pain Edu? --      Excl. in [place]? --     Vital signs reviewed, nursing assessments reviewed.   Constitutional:   Alert and oriented. Well appearing and in no distress. Eyes:   No scleral icterus. No conjunctival pallor. PERRL. EOMI.  No nystagmus. ENT   Head:   Normocephalic and atraumatic.   Nose:   No congestion/rhinnorhea. No septal hematoma   Mouth/Throat:   MMM, no pharyngeal erythema. No peritonsillar mass.    Neck:   No stridor. No SubQ emphysema. No meningismus. Hematological/Lymphatic/Immunilogical:   No cervical lymphadenopathy. Cardiovascular:   RRR. Symmetric bilateral radial  and DP pulses.  No murmurs.  Respiratory:   Normal respiratory effort without tachypnea nor retractions. Breath sounds are clear and equal bilaterally. No wheezes/rales/rhonchi.Right upper anterior chest wall tender which reproduces her pain. This pain can also be reproduced by having her internally rotate the right arm against resistance or adduct the right upper extremity against resistance. Gastrointestinal:   Soft and nontender. Non distended. There is no CVA tenderness.  No rebound, rigidity, or guarding. Genitourinary:   deferred Musculoskeletal:   Normal range of motion in all extremities. No joint effusions.  No lower extremity tenderness.  No edema. Neurologic:   Normal speech and language.  CN 2-10 normal. Motor grossly intact. No gross focal neurologic deficits are appreciated.  Skin:    Skin is warm, dry and intact. No rash noted.  No petechiae, purpura, or bullae.  ____________________________________________    LABS (pertinent positives/negatives) (all labs ordered are listed, but only abnormal results are displayed) Labs Reviewed  CBC - Abnormal; Notable for the following:       Result Value   WBC 12.2 (*)    All other components within normal limits  BASIC METABOLIC PANEL  TROPONIN I   ____________________________________________   EKG  Interpreted by me  Date: 03/13/2017  Rate: 86  Rhythm: normal sinus rhythm  QRS Axis: normal  Intervals: normal  ST/T Wave abnormalities: normal  Conduction Disutrbances: none  Narrative Interpretation: unremarkable      ____________________________________________    RADIOLOGY  Dg Chest 2 View  Result Date: 03/13/2017 CLINICAL DATA:  RIGHT-sided chest pain since 1430 hours going down arm, headache, smoker EXAM: CHEST  2 VIEW COMPARISON:  11/16/2016 FINDINGS: Normal heart size, mediastinal contours, and pulmonary vascularity. Lungs clear. No pleural effusion or pneumothorax. Bones unremarkable. IMPRESSION: Normal  exam. Electronically Signed   By: Lavonia Dana M.D.   On: 03/13/2017 17:43    ____________________________________________   PROCEDURES Procedures  ____________________________________________   INITIAL IMPRESSION / ASSESSMENT AND PLAN / ED COURSE  Pertinent labs & imaging results that were available during my care of the patient were reviewed by me and considered in my medical decision making (see chart for details).  Patient well appearing no acute distress, presents with nonspecific chest pain. On exam it is reproducible chest wall pain. History does not provide a clear sense of how she might of suffered an intercostal strain. Considering the patient's symptoms, medical history, and physical examination today, I have low suspicion for ACS, PE, TAD, pneumothorax, carditis, mediastinitis, pneumonia, CHF, or sepsis.  With her mildly elevated blood pressure, think is related to her pain and anxiety. Recommended that after his symptoms have resolved she recheck blood pressure and follow up with primary care.  ____________________________________________   FINAL CLINICAL IMPRESSION(S) / ED DIAGNOSES  Final diagnoses:  Chest wall pain      New Prescriptions   NAPROXEN (NAPROSYN) 500 MG TABLET    Take 1 tablet (500 mg total) by mouth 2 (two) times daily with a meal.     Portions of this note were generated with dragon dictation software. Dictation errors may occur despite best attempts at proofreading.    Carrie Mew, MD 03/13/17 2117

## 2017-12-14 ENCOUNTER — Emergency Department
Admission: EM | Admit: 2017-12-14 | Discharge: 2017-12-14 | Disposition: A | Discharge status: Home / Self Care | Payer: BLUE CROSS/BLUE SHIELD | Attending: Emergency Medicine | Admitting: Emergency Medicine

## 2017-12-14 ENCOUNTER — Emergency Department: Payer: BLUE CROSS/BLUE SHIELD

## 2017-12-14 DIAGNOSIS — R0602 Shortness of breath: Secondary | ICD-10-CM | POA: Insufficient documentation

## 2017-12-14 DIAGNOSIS — R42 Dizziness and giddiness: Secondary | ICD-10-CM | POA: Diagnosis not present

## 2017-12-14 DIAGNOSIS — F17210 Nicotine dependence, cigarettes, uncomplicated: Secondary | ICD-10-CM | POA: Diagnosis not present

## 2017-12-14 DIAGNOSIS — F410 Panic disorder [episodic paroxysmal anxiety] without agoraphobia: Secondary | ICD-10-CM | POA: Insufficient documentation

## 2017-12-14 LAB — CBC
HCT: 40.6 % (ref 35.0–47.0)
Hemoglobin: 13.8 g/dL (ref 12.0–16.0)
MCH: 28.2 pg (ref 26.0–34.0)
MCHC: 33.9 g/dL (ref 32.0–36.0)
MCV: 83.1 fL (ref 80.0–100.0)
PLATELETS: 345 10*3/uL (ref 150–440)
RBC: 4.88 MIL/uL (ref 3.80–5.20)
RDW: 15.1 % — ABNORMAL HIGH (ref 11.5–14.5)
WBC: 13 10*3/uL — ABNORMAL HIGH (ref 3.6–11.0)

## 2017-12-14 LAB — BASIC METABOLIC PANEL
Anion gap: 9 (ref 5–15)
BUN: 10 mg/dL (ref 6–20)
CALCIUM: 9.2 mg/dL (ref 8.9–10.3)
CHLORIDE: 106 mmol/L (ref 101–111)
CO2: 22 mmol/L (ref 22–32)
CREATININE: 0.72 mg/dL (ref 0.44–1.00)
GFR calc non Af Amer: >60 mL/min (ref >60)
GLUCOSE: 79 mg/dL (ref 65–99)
Potassium: 3.3 mmol/L — ABNORMAL LOW (ref 3.5–5.1)
Sodium: 137 mmol/L (ref 135–145)

## 2017-12-14 LAB — TROPONIN I: Troponin I: <0.03 ng/mL (ref <0.03)

## 2017-12-14 MED ORDER — CLONAZEPAM 1 MG PO TABS: 1.0000 mg | ORAL_TABLET | Freq: Two times a day (BID) | ORAL | 0 refills | Status: DC | PRN

## 2017-12-14 MED ORDER — DIAZEPAM 5 MG PO TABS
10.0000 mg | ORAL_TABLET | Freq: Once | ORAL | Status: AC
  Administered 2017-12-14: 10 mg via ORAL
  Filled 2017-12-14: qty 2

## 2017-12-14 NOTE — ED Triage Notes (Signed)
Patient c/o anxiety, SOB, dizziness. Patient reports hx of panic attacks. Patient denies chest pain, however, reports that she often has chest pain with panic attacks.

## 2017-12-14 NOTE — ED Provider Notes (Signed)
[facility] Emergency Department Provider Note       Time seen: ----------------------------------------- 8:51 PM on 12/14/2017 -----------------------------------------   I have reviewed the triage vital signs and the nursing notes.  HISTORY   Chief Complaint Panic Attack    HPI Robin Saunders is a 36 y.o. female with a history of anxiety and high cholesterol who presents to the ED for anxiety, shortness of breath and dizziness.  Patient reports she has a history of panic attacks.  She denies any chest pain however she often has chest pain with panic attacks.  Patient states she used to take an antidepressant as well as Klonopin but she ran out of her Klonopin prescription several months ago.  She states she was taking 0.5 mg tablet about every 3 days.  She admits to being under significant stress at work.  Past Medical History:  Diagnosis Date  . Anxiety   . Cyst, ovarian   . High cholesterol     There are no active problems to display for this patient.   Past Surgical History:  Procedure Laterality Date  . CHOLECYSTECTOMY      Allergies Shellfish allergy and Tylenol [acetaminophen]  Social History Social History   Tobacco Use  . Smoking status: Current Every Day Smoker    Packs/day: 1.00    Types: Cigarettes  . Smokeless tobacco: Never Used  Substance Use Topics  . Alcohol use: No  . Drug use: Not on file    Review of Systems Constitutional: Negative for fever. Cardiovascular: Negative for chest pain. Respiratory: Positive for shortness of breath Gastrointestinal: Negative for abdominal pain, vomiting and diarrhea. Genitourinary: Negative for dysuria. Musculoskeletal: Negative for back pain. Skin: Negative for rash. Neurological: Negative for headaches, focal weakness or numbness. Psychiatric: Positive for anxiety  All systems negative/normal/unremarkable except as stated in the  HPI  ____________________________________________   PHYSICAL EXAM:  VITAL SIGNS: ED Triage Vitals  Enc Vitals Group     BP 12/14/17 2034 (!) 179/88     Pulse Rate 12/14/17 2034 89     Resp 12/14/17 2034 (!) 24     Temp 12/14/17 2034 97.9 F (36.6 C)     Temp Source 12/14/17 2034 Oral     SpO2 12/14/17 2034 100 %     Weight 12/14/17 2039 209 lb (94.8 kg)     Height --      Head Circumference --      Peak Flow --      Pain Score --      Pain Loc --      Pain Edu? --      Excl. in [place]? --     Constitutional: Alert and oriented.  Anxious, no distress Eyes: Conjunctivae are normal. Normal extraocular movements. ENT   Head: Normocephalic and atraumatic.   Nose: No congestion/rhinnorhea.   Mouth/Throat: Mucous membranes are moist.   Neck: No stridor. Cardiovascular: Normal rate, regular rhythm. No murmurs, rubs, or gallops. Respiratory: Normal respiratory effort without tachypnea nor retractions. Breath sounds are clear and equal bilaterally. No wheezes/rales/rhonchi. Gastrointestinal: Soft and nontender. Normal bowel sounds Musculoskeletal: Nontender with normal range of motion in extremities. No lower extremity tenderness nor edema. Neurologic:  Normal speech and language. No gross focal neurologic deficits are appreciated.  Skin:  Skin is warm, dry and intact. No rash noted. Psychiatric: Anxious mood and affect ____________________________________________  EKG: Interpreted by me.  Sinus rhythm rate 85 bpm, normal PR interval, normal QRS, normal QT.  ____________________________________________  ED COURSE:  As part of my medical decision making, I reviewed the following data within the [place] History obtained from family if available, nursing notes, old chart and ekg, as well as notes from prior ED visits. Patient presented for dyspnea and likely panic attack, we will assess with labs and imaging as indicated at this time.    Procedures ____________________________________________   LABS (pertinent positives/negatives)  Labs Reviewed  BASIC METABOLIC PANEL - Abnormal; Notable for the following components:      Result Value   Potassium 3.3 (*)    All other components within normal limits  CBC - Abnormal; Notable for the following components:   WBC 13.0 (*)    RDW 15.1 (*)    All other components within normal limits  TROPONIN I  POC URINE PREG, ED    RADIOLOGY  Chest x-ray is normal  ____________________________________________  DIFFERENTIAL DIAGNOSIS   Anxiety, PE, pneumothorax, pneumonia, MI, anemia  FINAL ASSESSMENT AND PLAN  Panic attack   Plan: Patient had presented for anxious symptoms. Patient's labs were overall reassuring. Patient's imaging was negative for acute process.  I will prescribe a short supply of Klonopin and refer her for outpatient follow-up.   Earleen Newport, MD   Note: This note was generated in part or whole with voice recognition software. Voice recognition is usually quite accurate but there are transcription errors that can and very often do occur. I apologize for any typographical errors that were not detected and corrected.     Earleen Newport, MD 12/14/17 2112

## 2018-03-02 ENCOUNTER — Other Ambulatory Visit: Payer: Self-pay | Admitting: Family Medicine

## 2018-03-02 DIAGNOSIS — N6019 Diffuse cystic mastopathy of unspecified breast: Secondary | ICD-10-CM

## 2018-03-07 ENCOUNTER — Ambulatory Visit
Admission: RE | Admit: 2018-03-07 | Discharge: 2018-03-07 | Disposition: A | Discharge status: Home / Self Care | Payer: BLUE CROSS/BLUE SHIELD | Source: Ambulatory Visit | Attending: Family Medicine | Admitting: Family Medicine

## 2018-03-07 DIAGNOSIS — N6321 Unspecified lump in the left breast, upper outer quadrant: Secondary | ICD-10-CM | POA: Insufficient documentation

## 2018-03-07 DIAGNOSIS — N6311 Unspecified lump in the right breast, upper outer quadrant: Secondary | ICD-10-CM | POA: Diagnosis not present

## 2018-03-07 DIAGNOSIS — N6019 Diffuse cystic mastopathy of unspecified breast: Secondary | ICD-10-CM

## 2018-03-07 DIAGNOSIS — R921 Mammographic calcification found on diagnostic imaging of breast: Secondary | ICD-10-CM | POA: Diagnosis not present

## 2018-03-09 ENCOUNTER — Other Ambulatory Visit: Payer: Self-pay | Admitting: Family Medicine

## 2018-03-09 DIAGNOSIS — N6310 Unspecified lump in the right breast, unspecified quadrant: Secondary | ICD-10-CM

## 2018-03-09 DIAGNOSIS — R928 Other abnormal and inconclusive findings on diagnostic imaging of breast: Secondary | ICD-10-CM

## 2018-03-09 DIAGNOSIS — N6320 Unspecified lump in the left breast, unspecified quadrant: Secondary | ICD-10-CM

## 2018-03-14 ENCOUNTER — Ambulatory Visit
Admission: RE | Admit: 2018-03-14 | Discharge: 2018-03-14 | Disposition: A | Discharge status: Home / Self Care | Payer: BLUE CROSS/BLUE SHIELD | Source: Ambulatory Visit | Attending: Family Medicine | Admitting: Family Medicine

## 2018-03-14 ENCOUNTER — Other Ambulatory Visit: Payer: Self-pay | Admitting: Family Medicine

## 2018-03-14 DIAGNOSIS — D242 Benign neoplasm of left breast: Secondary | ICD-10-CM | POA: Diagnosis not present

## 2018-03-14 DIAGNOSIS — R928 Other abnormal and inconclusive findings on diagnostic imaging of breast: Secondary | ICD-10-CM

## 2018-03-14 DIAGNOSIS — D241 Benign neoplasm of right breast: Secondary | ICD-10-CM | POA: Diagnosis not present

## 2018-03-14 DIAGNOSIS — N6310 Unspecified lump in the right breast, unspecified quadrant: Secondary | ICD-10-CM

## 2018-03-14 DIAGNOSIS — N6320 Unspecified lump in the left breast, unspecified quadrant: Secondary | ICD-10-CM

## 2018-03-14 HISTORY — PX: BREAST BIOPSY: SHX20

## 2018-03-15 LAB — SURGICAL PATHOLOGY

## 2018-05-08 ENCOUNTER — Emergency Department: Payer: BLUE CROSS/BLUE SHIELD

## 2018-05-08 ENCOUNTER — Encounter: Payer: Self-pay | Admitting: *Deleted

## 2018-05-08 ENCOUNTER — Emergency Department
Admission: EM | Admit: 2018-05-08 | Discharge: 2018-05-08 | Disposition: A | Discharge status: Home / Self Care | Payer: BLUE CROSS/BLUE SHIELD | Attending: Emergency Medicine | Admitting: Emergency Medicine

## 2018-05-08 ENCOUNTER — Other Ambulatory Visit: Payer: Self-pay

## 2018-05-08 DIAGNOSIS — Z7982 Long term (current) use of aspirin: Secondary | ICD-10-CM | POA: Diagnosis not present

## 2018-05-08 DIAGNOSIS — F17210 Nicotine dependence, cigarettes, uncomplicated: Secondary | ICD-10-CM | POA: Diagnosis not present

## 2018-05-08 DIAGNOSIS — R1031 Right lower quadrant pain: Secondary | ICD-10-CM | POA: Insufficient documentation

## 2018-05-08 DIAGNOSIS — Z79899 Other long term (current) drug therapy: Secondary | ICD-10-CM | POA: Diagnosis not present

## 2018-05-08 DIAGNOSIS — R109 Unspecified abdominal pain: Secondary | ICD-10-CM

## 2018-05-08 LAB — URINALYSIS, COMPLETE (UACMP) WITH MICROSCOPIC
Bilirubin Urine: NEGATIVE
Glucose, UA: NEGATIVE mg/dL
Hgb urine dipstick: NEGATIVE
Ketones, ur: NEGATIVE mg/dL
Leukocytes, UA: NEGATIVE
Nitrite: NEGATIVE
PH: 7 (ref 5.0–8.0)
Protein, ur: NEGATIVE mg/dL
SPECIFIC GRAVITY, URINE: 1.018 (ref 1.005–1.030)

## 2018-05-08 LAB — WET PREP, GENITAL
Clue Cells Wet Prep HPF POC: NONE SEEN
Sperm: NONE SEEN
TRICH WET PREP: NONE SEEN
WBC, Wet Prep HPF POC: NONE SEEN
YEAST WET PREP: NONE SEEN

## 2018-05-08 LAB — COMPREHENSIVE METABOLIC PANEL
ALBUMIN: 4.3 g/dL (ref 3.5–5.0)
ALT: 13 U/L — AB (ref 14–54)
AST: 15 U/L (ref 15–41)
Alkaline Phosphatase: 56 U/L (ref 38–126)
Anion gap: 8 (ref 5–15)
BUN: 7 mg/dL (ref 6–20)
CO2: 25 mmol/L (ref 22–32)
CREATININE: 0.73 mg/dL (ref 0.44–1.00)
Calcium: 9 mg/dL (ref 8.9–10.3)
Chloride: 103 mmol/L (ref 101–111)
GFR calc Af Amer: >60 mL/min (ref >60)
GFR calc non Af Amer: >60 mL/min (ref >60)
GLUCOSE: 92 mg/dL (ref 65–99)
Potassium: 3.4 mmol/L — ABNORMAL LOW (ref 3.5–5.1)
SODIUM: 136 mmol/L (ref 135–145)
Total Bilirubin: 1 mg/dL (ref 0.3–1.2)
Total Protein: 8 g/dL (ref 6.5–8.1)

## 2018-05-08 LAB — CBC
HCT: 38.6 % (ref 35.0–47.0)
Hemoglobin: 13.4 g/dL (ref 12.0–16.0)
MCH: 29.9 pg (ref 26.0–34.0)
MCHC: 34.6 g/dL (ref 32.0–36.0)
MCV: 86.3 fL (ref 80.0–100.0)
PLATELETS: 310 10*3/uL (ref 150–440)
RBC: 4.47 MIL/uL (ref 3.80–5.20)
RDW: 14.4 % (ref 11.5–14.5)
WBC: 11.3 10*3/uL — ABNORMAL HIGH (ref 3.6–11.0)

## 2018-05-08 LAB — CHLAMYDIA/NGC RT PCR (ARMC ONLY)
CHLAMYDIA TR: NOT DETECTED
N gonorrhoeae: NOT DETECTED

## 2018-05-08 LAB — POC URINE PREG, ED: Preg Test, Ur: NEGATIVE

## 2018-05-08 LAB — LIPASE, BLOOD: Lipase: 24 U/L (ref 11–51)

## 2018-05-08 MED ORDER — MORPHINE SULFATE (PF) 4 MG/ML IV SOLN
6.0000 mg | Freq: Once | INTRAVENOUS | Status: AC
  Administered 2018-05-08: 6 mg via INTRAMUSCULAR
  Filled 2018-05-08: qty 2

## 2018-05-08 MED ORDER — IOPAMIDOL (ISOVUE-370) INJECTION 76%
75.0000 mL | Freq: Once | INTRAVENOUS | Status: AC | PRN
  Administered 2018-05-08: 75 mL via INTRAVENOUS
  Filled 2018-05-08: qty 75

## 2018-05-08 MED ORDER — IOPAMIDOL (ISOVUE-300) INJECTION 61%
30.0000 mL | Freq: Once | INTRAVENOUS | Status: AC | PRN
  Administered 2018-05-08: 30 mL via ORAL
  Filled 2018-05-08: qty 30

## 2018-05-08 NOTE — ED Triage Notes (Signed)
Pt to Ed reporting sudden onset of RLQ abd pain that is radiating to umbilicus 1 hour ago. Pt reports abd is tender upon palpation. Nausea but no vomiting reported. No diarrhea.  Gallbladder has been removed. Hx of UTIs and ovarian cysts. No changes in urine reported but intermittent vaginal bleeding reported to be abnormal for patient since starting Depo shot.

## 2018-05-08 NOTE — ED Provider Notes (Addendum)
[facility] Emergency Department Provider Note  ____________________________________________   I have reviewed the triage vital signs and the nursing notes. Where available I have reviewed prior notes and, if possible and indicated, outside hospital notes.    HISTORY  Chief Complaint Abdominal Pain    HPI Robin Saunders is a 37 y.o. female   History of ovarian cyst, anxiety, she is on Depakote, states that she had sudden onset right lower quadrant abdominal pain in the adnexal region very similar to prior ovarian cyst.  No significant radiation positive nausea no vomiting no diarrhea.  No fever, began at rest nothing makes it better nothing makes it worse is tried nothing since it began.  Patient states that she has had no fevers or chills no dysuria no urinary frequency no diarrhea no hematuria no vomiting etc.  No other alleviating or aggravating factors.  She did not drive here.  She got a ride from a friend.  She understands that after medication here she cannot drive home.  No vaginal discharge or concern for STI     Past Medical History:  Diagnosis Date  . Anxiety   . Cyst, ovarian   . High cholesterol     There are no active problems to display for this patient.   Past Surgical History:  Procedure Laterality Date  . CHOLECYSTECTOMY      Prior to Admission medications   Medication Sig Start Date End Date Taking? Authorizing Provider  aspirin 81 MG tablet Take 81 mg by mouth daily.    [provider]  busPIRone (BUSPAR) 15 MG tablet Take 1 tablet by mouth 2 (two) times daily. 12/21/16   [provider]  clonazePAM (KLONOPIN) 0.5 MG tablet Take 1 tablet by mouth daily. 12/21/16   [provider]  clonazePAM (KLONOPIN) 1 MG tablet Take 1 tablet (1 mg total) by mouth 2 (two) times daily as needed for anxiety. 12/14/17 12/14/18  Earleen Newport, MD  FLUoxetine (PROZAC) 20 MG tablet Take 1 tablet (20 mg total) by mouth  at bedtime. 12/27/16   Paulette Blanch, MD  naproxen (NAPROSYN) 500 MG tablet Take 1 tablet (500 mg total) by mouth 2 (two) times daily with a meal. 03/13/17   Carrie Mew, MD  OLANZapine [facility]) 2.5 MG tablet 1/2 tablet every night 12/27/16   Paulette Blanch, MD  sertraline (ZOLOFT) 25 MG tablet Take 2 tablets by mouth at bedtime.  12/21/16   [provider]  sertraline (ZOLOFT) 50 MG tablet Take 1 tablet by mouth daily. 12/21/16   [provider]    Allergies Shellfish allergy and Tylenol [acetaminophen]  History reviewed. No pertinent family history.  Social History Social History   Tobacco Use  . Smoking status: Current Every Day Smoker    Packs/day: 1.00    Types: Cigarettes  . Smokeless tobacco: Never Used  Substance Use Topics  . Alcohol use: No  . Drug use: Not on file    Review of Systems Constitutional: No fever/chills Eyes: No visual changes. ENT: No sore throat. No stiff neck no neck pain Cardiovascular: Denies chest pain. Respiratory: Denies shortness of breath. Gastrointestinal:   no vomiting.  No diarrhea.  No constipation. Genitourinary: Negative for dysuria. Musculoskeletal: Negative lower extremity swelling Skin: Negative for rash. Neurological: Negative for severe headaches, focal weakness or numbness.   ____________________________________________   PHYSICAL EXAM:  VITAL SIGNS: ED Triage Vitals  Enc Vitals Group     BP 05/08/18 1217 124/77  Pulse Rate 05/08/18 1217 77     Resp 05/08/18 1217 16     Temp 05/08/18 1217 98.2 F (36.8 C)     Temp Source 05/08/18 1217 Oral     SpO2 05/08/18 1217 100 %     Weight 05/08/18 1216 198 lb (89.8 kg)     Height 05/08/18 1216 5\' 6"  (1.676 m)     Head Circumference --      Peak Flow --      Pain Score 05/08/18 1216 6     Pain Loc --      Pain Edu? --      Excl. in [place]? --     Constitutional: Alert and oriented. Well appearing and in no acute distress. Eyes: Conjunctivae are  normal Head: Atraumatic HEENT: No congestion/rhinnorhea. Mucous membranes are moist.  Oropharynx non-erythematous Neck:   Nontender with no meningismus, no masses, no stridor Cardiovascular: Normal rate, regular rhythm. Grossly normal heart sounds.  Good peripheral circulation. Respiratory: Normal respiratory effort.  No retractions. Lungs CTAB. Abdominal: Soft and nontender. No distention. No guarding no rebound Back:  There is no focal tenderness or step off.  there is no midline tenderness there are no lesions noted. there is no CVA tenderness Pelvic exam: Female nurse chaperone present, no external lesions noted, physiologic vaginal discharge noted with no purulent discharge, no cervical motion tenderness, positive right adnexal tenderness no mass, there is no significant uterine tenderness or mass. No vaginal bleeding Musculoskeletal: No lower extremity tenderness, no upper extremity tenderness. No joint effusions, no DVT signs strong distal pulses no edema Neurologic:  Normal speech and language. No gross focal neurologic deficits are appreciated.  Skin:  Skin is warm, dry and intact. No rash noted. Psychiatric: Mood and affect are normal. Speech and behavior are normal.  ____________________________________________   LABS (all labs ordered are listed, but only abnormal results are displayed)  Labs Reviewed  COMPREHENSIVE METABOLIC PANEL - Abnormal; Notable for the following components:      Result Value   Potassium 3.4 (*)    ALT 13 (*)    All other components within normal limits  CBC - Abnormal; Notable for the following components:   WBC 11.3 (*)    All other components within normal limits  URINALYSIS, COMPLETE (UACMP) WITH MICROSCOPIC - Abnormal; Notable for the following components:   Color, Urine YELLOW (*)    APPearance CLOUDY (*)    Bacteria, UA RARE (*)    All other components within normal limits  CHLAMYDIA/NGC RT PCR ([facility] ONLY)  WET PREP, GENITAL   LIPASE, BLOOD  POC URINE PREG, ED    Pertinent labs  results that were available during my care of the patient were reviewed by me and considered in my medical decision making (see chart for details). ____________________________________________  EKG  I personally interpreted any EKGs ordered by me or triage  ____________________________________________  RADIOLOGY  Pertinent labs & imaging results that were available during my care of the patient were reviewed by me and considered in my medical decision making (see chart for details). If possible, patient and/or family made aware of any abnormal findings.  No results found. ____________________________________________    PROCEDURES  Procedure(s) performed: None  Procedures  Critical Care performed: None  ____________________________________________   INITIAL IMPRESSION / ASSESSMENT AND PLAN / ED COURSE  Pertinent labs & imaging results that were available during my care of the patient were reviewed by me and considered in my medical decision making (see chart  for details).  Patient here with nausea and sudden onset right lower quadrant pain differential does include appendicitis although considered to be less likely given very sudden onset of symptoms no fever no white count no vomiting, also included in differential is kidney stone but again no hematuria, and most likely is ovarian cyst will obtain ultrasound and reassess pain medication will be administered.  ----------------------------------------- 5:41 PM on 05/08/2018 -----------------------------------------  Work-up in the emergency room reassuring, abdomen benign at this time.  Considering the patient's symptoms, medical history, and physical examination today, I have low suspicion for cholecystitis or biliary pathology, pancreatitis, perforation or bowel obstruction, hernia, intra-abdominal abscess, AAA or dissection, volvulus or intussusception, mesenteric  ischemia, ischemic gut, pyelonephritis or appendicitis.  Nor is there anything to suggest PID, or TOA etc. Extensive of return precautions given and understood.  Patient very comfortable with discharge    ____________________________________________   FINAL CLINICAL IMPRESSION(S) / ED DIAGNOSES  Final diagnoses:  Abdominal pain      This chart was dictated using voice recognition software.  Despite best efforts to proofread,  errors can occur which can change meaning.      Schuyler Amor, MD 05/08/18 1447    Schuyler Amor, MD 05/08/18 [phone]

## 2018-05-08 NOTE — ED Notes (Signed)
CT contacted that pt has finished oral contrast.

## 2019-05-14 IMAGING — MG MM DIGITAL DIAGNOSTIC BILAT W/ TOMO W/ CAD
7 of 19 series · 7 of 40 positions shown · non-contrast
Comparison: Previous exam(s).

CLINICAL DATA: Bilateral palpable masses felt by the patient
approximately 1 week ago. History of benign-appearing left breast
masses.

EXAM:
DIGITAL DIAGNOSTIC BILATERAL MAMMOGRAM WITH CAD AND TOMO
ULTRASOUND BILATERAL BREAST

[R CC synth-2D (1 of 2)]
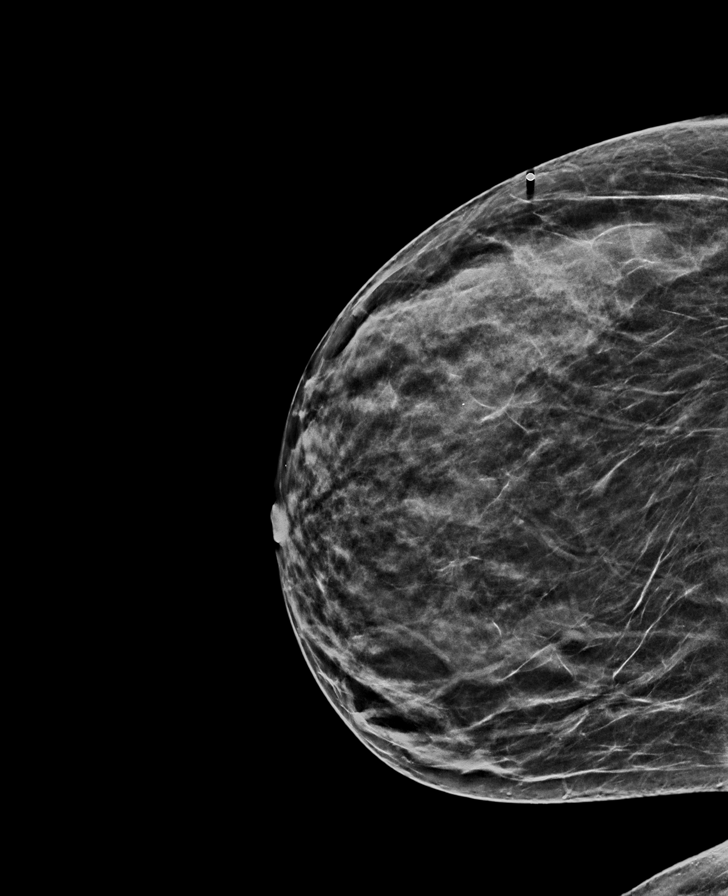

[L CC]
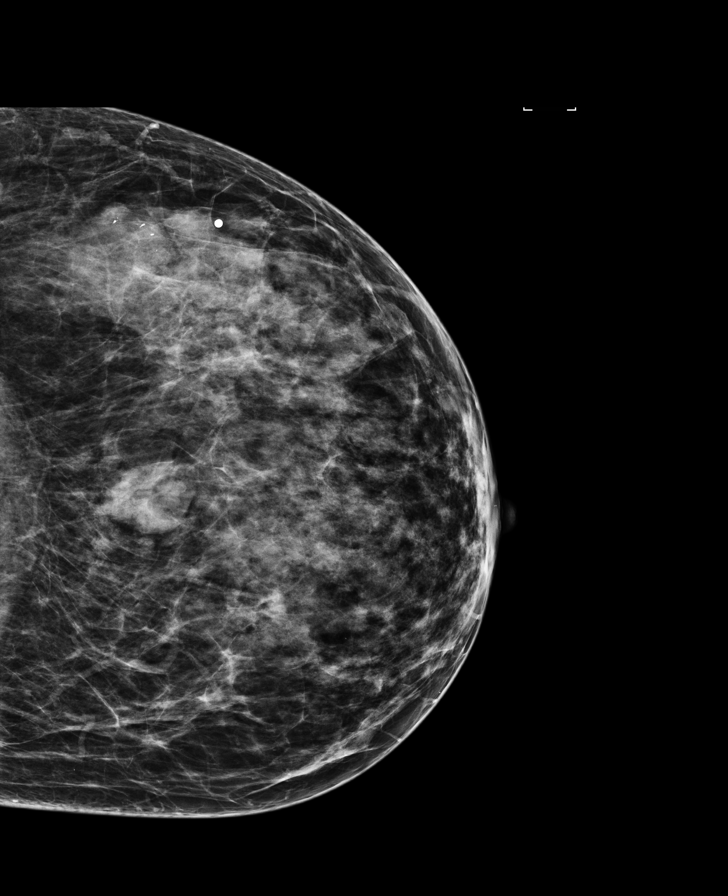

[R MLO synth-2D]
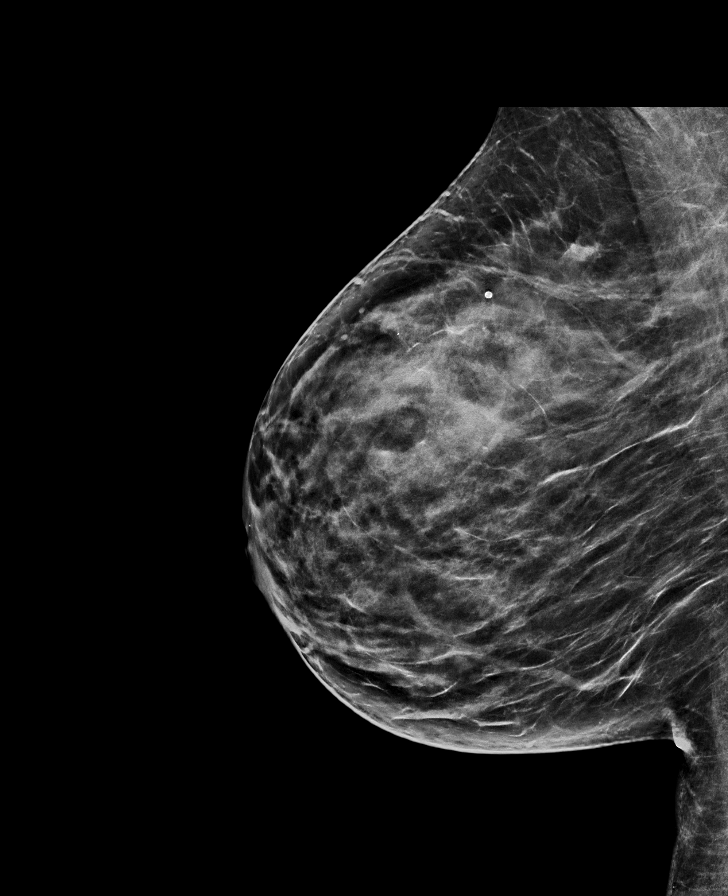

[L CC synth-2D]
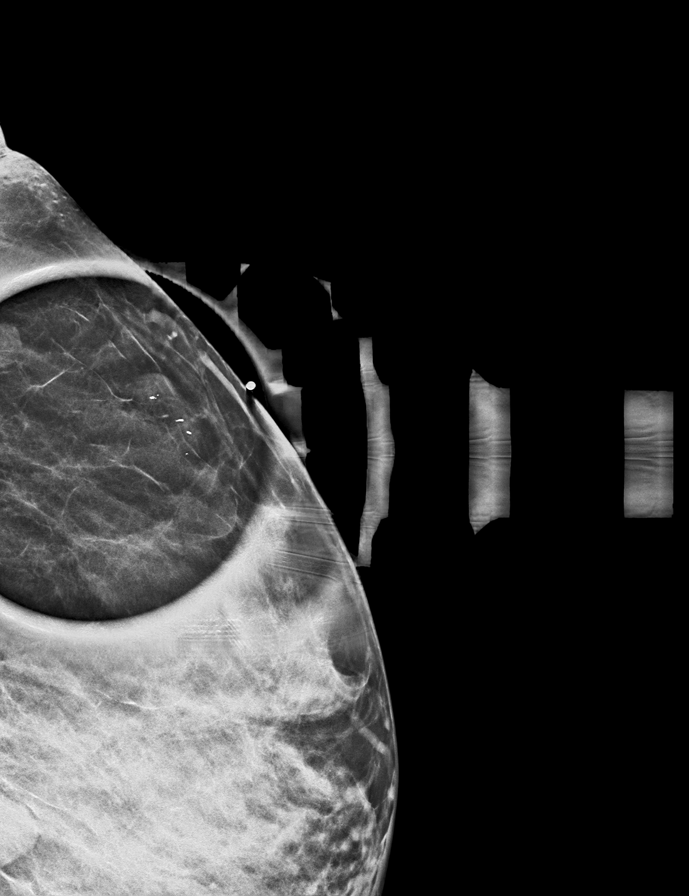

[R CC synth-2D (2 of 2)]
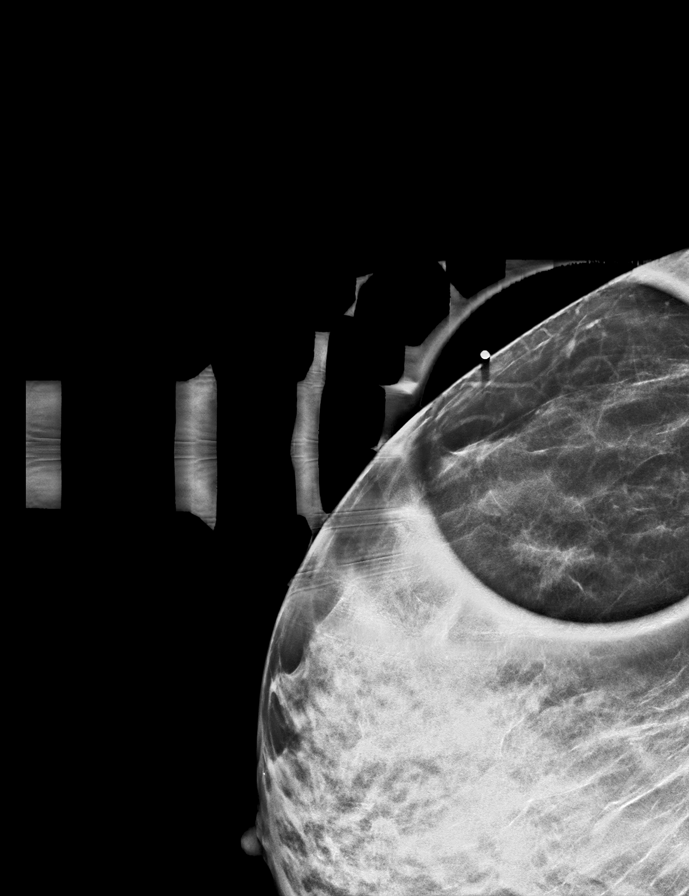

[R CC]
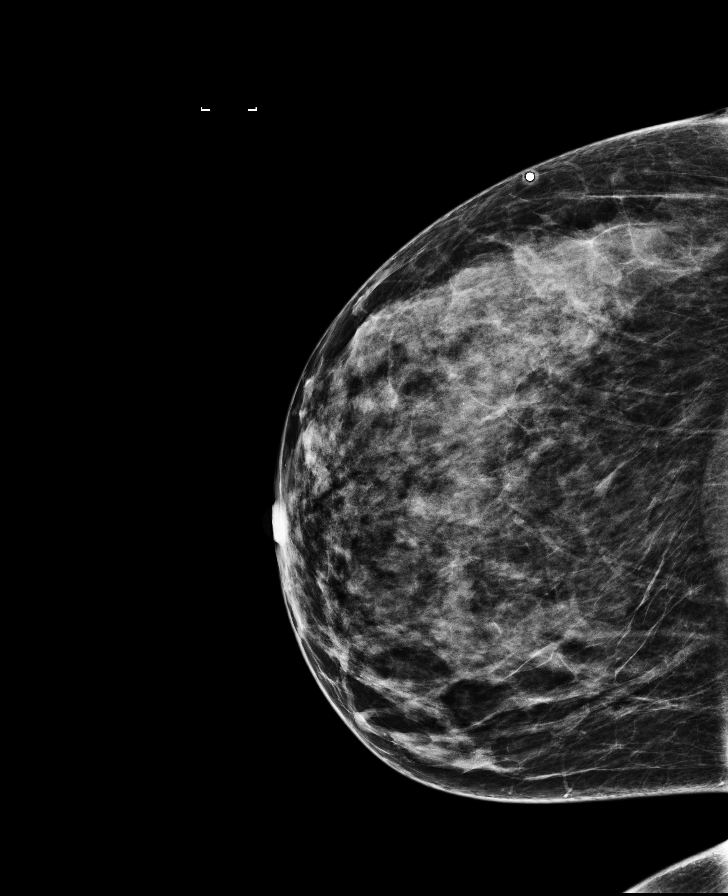

[L MLO synth-2D]
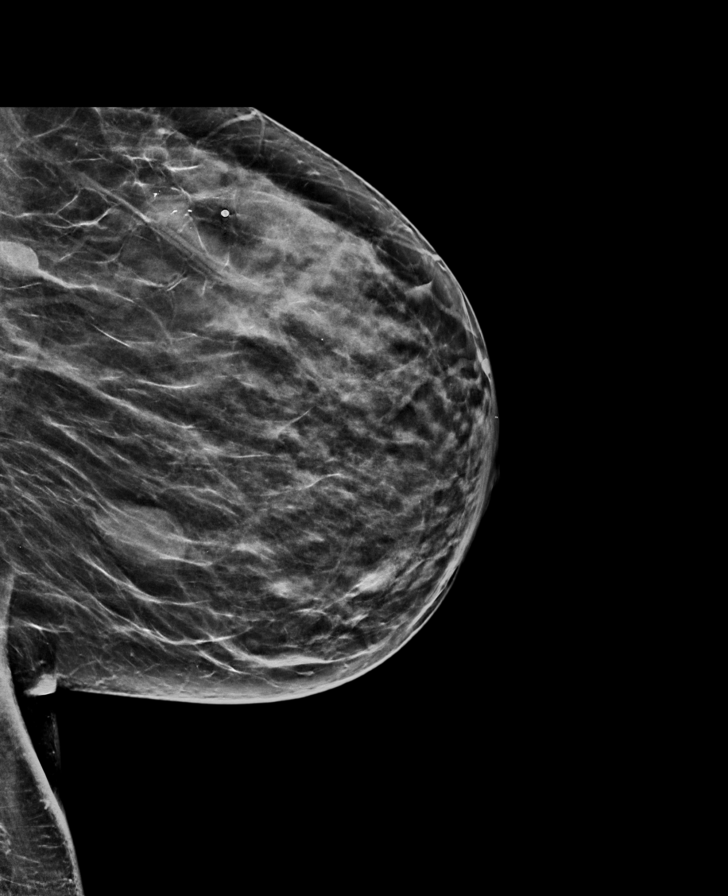

[7 of 40 positions shown; findings below may reference images not displayed]

ACR Breast Density Category c: The breast tissue is heterogeneously
dense, which may obscure small masses.
FINDINGS: Left breast: Mammographically, there has been interval development
of 13 mm mass in the lower axilla or axillary tail of the left
breast. There also 2 circumscribed masses in the lower slightly
inner left breast, anterior depth. At the site of palpable concern
left breast upper outer quadrant, posterior depth, there is a
bilobed calcifications containing mass or 2 adjacent circumscribed
masses. The previously demonstrated left breast 4 o'clock probable
fibroadenoma is stable.

Right breast: There is an isodense to breast parenchyma
circumscribed nodule in the right breast slightly upper outer
quadrant, middle depth. There is also an angulated 11 mm nodule in
the right breast upper outer quadrant, posterior depth, better seen
on the MLO view. This finding may correspond to the area of palpable
concern in patient's right breast.

Mammographic images were processed with CAD.

On physical exam, palpable circumscribed masses are noted in the
left breast upper outer quadrant, posterior depth and in the right
breast, slightly upper outer quadrant, middle to posterior depth.

Targeted left breast ultrasound is performed, showing several
hypoechoic masses. In the left breast 1 o'clock 9 cm from the nipple
there is a lobulated hypoechoic circumscribed mass which measures
2.9 x 1.0 x 2.2 cm. Calcifications are seen within the more
superficial portion of this mass, which corresponds to the area of
palpable concern. In the left breast 4 o'clock 5 cm from nipple
there is a circumscribed hypoechoic circumscribed mass which
measures 2.8 x 1.1 x 2.0 cm. In the left breast 4 o'clock 2 cm from
the nipple there is a hypoechoic circumscribed horizontally oriented
mass which measures 0.8 x 1.6 x 0.6 cm. Adjacent to it, there is a
second 0.4 x 0.2 x 0.4 cm hypoechoic mass. In the left breast 2
o'clock 12 cm from the nipple there is a hypoechoic circumscribed
somewhat round mass which measures 0.8 x 1.4 x 0.8 cm. There is no
evidence of left axillary lymphadenopathy.

Targeted right breast ultrasound is performed, showing 10:30 o'clock
7 cm from the nipple hypoechoic somewhat angulated mass which
measures 0.9 x 1.2 x 0.5 cm. This finding likely corresponds to the
mammographically seen nodule in the posterior upper outer quadrant.
In the right breast 10 o'clock 11 cm from the nipple, there is a
hypoechoic circumscribed mass which measures 1.1 x 0.7 x 1.4 cm.
IMPRESSION: Multiple bilateral solid appearing masses which likely represent
fibroadenomas.

Given the sonographic appearance and palpable nature of some of
these masses, ultrasound-guided core needle biopsy is recommended
for the left breast 2 o'clock 1.4 cm mass, left breast 1 o'clock
cm mass, and right breast 10:30 o'clock 1.2 cm mass.

RECOMMENDATION:
Ultrasound-guided core needle biopsy of both breasts.

I have discussed the findings and recommendations with the patient.
Results were also provided in writing at the conclusion of the
visit. If applicable, a reminder letter will be sent to the patient
regarding the next appointment.

BI-RADS CATEGORY  4: Suspicious.

## 2019-08-24 ENCOUNTER — Emergency Department: Payer: Medicaid Other

## 2019-08-24 ENCOUNTER — Other Ambulatory Visit: Payer: Self-pay

## 2019-08-24 ENCOUNTER — Encounter: Payer: Self-pay | Admitting: Emergency Medicine

## 2019-08-24 ENCOUNTER — Emergency Department
Admission: EM | Admit: 2019-08-24 | Discharge: 2019-08-24 | Disposition: A | Discharge status: Home / Self Care | Payer: Medicaid Other | Attending: Emergency Medicine | Admitting: Emergency Medicine

## 2019-08-24 DIAGNOSIS — Z7982 Long term (current) use of aspirin: Secondary | ICD-10-CM | POA: Diagnosis not present

## 2019-08-24 DIAGNOSIS — Z79899 Other long term (current) drug therapy: Secondary | ICD-10-CM | POA: Diagnosis not present

## 2019-08-24 DIAGNOSIS — R1030 Lower abdominal pain, unspecified: Secondary | ICD-10-CM | POA: Diagnosis present

## 2019-08-24 DIAGNOSIS — F17210 Nicotine dependence, cigarettes, uncomplicated: Secondary | ICD-10-CM | POA: Diagnosis not present

## 2019-08-24 LAB — URINALYSIS, COMPLETE (UACMP) WITH MICROSCOPIC
Bilirubin Urine: NEGATIVE
Glucose, UA: NEGATIVE mg/dL
Hgb urine dipstick: NEGATIVE
Ketones, ur: NEGATIVE mg/dL
Leukocytes,Ua: NEGATIVE
Nitrite: NEGATIVE
Protein, ur: NEGATIVE mg/dL
Specific Gravity, Urine: 1.008 (ref 1.005–1.030)
pH: 7 (ref 5.0–8.0)

## 2019-08-24 LAB — COMPREHENSIVE METABOLIC PANEL
ALT: 12 U/L (ref 0–44)
AST: 16 U/L (ref 15–41)
Albumin: 4.2 g/dL (ref 3.5–5.0)
Alkaline Phosphatase: 42 U/L (ref 38–126)
Anion gap: 8 (ref 5–15)
BUN: 5 mg/dL — ABNORMAL LOW (ref 6–20)
CO2: 23 mmol/L (ref 22–32)
Calcium: 9.1 mg/dL (ref 8.9–10.3)
Chloride: 105 mmol/L (ref 98–111)
Creatinine, Ser: 0.64 mg/dL (ref 0.44–1.00)
GFR calc Af Amer: >60 mL/min (ref >60)
GFR calc non Af Amer: >60 mL/min (ref >60)
Glucose, Bld: 89 mg/dL (ref 70–99)
Potassium: 4 mmol/L (ref 3.5–5.1)
Sodium: 136 mmol/L (ref 135–145)
Total Bilirubin: 0.7 mg/dL (ref 0.3–1.2)
Total Protein: 7.5 g/dL (ref 6.5–8.1)

## 2019-08-24 LAB — CBC
HCT: 44.8 % (ref 36.0–46.0)
Hemoglobin: 15 g/dL (ref 12.0–15.0)
MCH: 28.8 pg (ref 26.0–34.0)
MCHC: 33.5 g/dL (ref 30.0–36.0)
MCV: 86.2 fL (ref 80.0–100.0)
Platelets: 309 10*3/uL (ref 150–400)
RBC: 5.2 MIL/uL — ABNORMAL HIGH (ref 3.87–5.11)
RDW: 13.8 % (ref 11.5–15.5)
WBC: 10.3 10*3/uL (ref 4.0–10.5)
nRBC: 0 % (ref 0.0–0.2)

## 2019-08-24 LAB — POCT PREGNANCY, URINE: Preg Test, Ur: NEGATIVE

## 2019-08-24 LAB — LIPASE, BLOOD: Lipase: 28 U/L (ref 11–51)

## 2019-08-24 MED ORDER — SODIUM CHLORIDE 0.9% FLUSH: 3.0000 mL | Freq: Once | INTRAVENOUS | Status: DC

## 2019-08-24 MED ORDER — DICYCLOMINE HCL 10 MG PO CAPS
20.0000 mg | ORAL_CAPSULE | Freq: Once | ORAL | Status: AC
  Administered 2019-08-24: 20 mg via ORAL
  Filled 2019-08-24: qty 2

## 2019-08-24 MED ORDER — DICYCLOMINE HCL 20 MG PO TABS: 20.0000 mg | ORAL_TABLET | Freq: Three times a day (TID) | ORAL | 0 refills | Status: DC | PRN

## 2019-08-24 NOTE — ED Triage Notes (Signed)
Pt to ED via POV c/o lower abdominal cramping x 1 week. Pt states that she has also had nausea. Pt states that she was seen by her PCP and had COVID test done, which was negative, but pt has continued to have cramps and nausea. Pt is in NAD.

## 2019-08-24 NOTE — ED Provider Notes (Signed)
[facility] Emergency Department Provider Note  Time seen: 11:52 AM  I have reviewed the triage vital signs and the nursing notes.   HISTORY  Chief Complaint Abdominal Cramping and Nausea   HPI Robin Saunders is a 38 y.o. female with a past medical history of anxiety, hyperlipidemia, ovarian cyst, presents to the emergency department for lower abdominal pain.  According to the patient for the past 5 or 6 days she has been experiencing intermittent lower abdominal pain/cramping.  States some nausea but denies any vomiting.  States 2 episodes of diarrhea earlier in the week but none recently.  Denies any fever.  Denies any cough or congestion.  Patient was seen this past week at her primary care office for the same, states she had a negative COVID swab.  Describes the pain as 5/10 sharp/stabbing pain in the lower abdomen.  Past Medical History:  Diagnosis Date  . Anxiety   . Cyst, ovarian   . High cholesterol     There are no active problems to display for this patient.   Past Surgical History:  Procedure Laterality Date  . CHOLECYSTECTOMY      Prior to Admission medications   Medication Sig Start Date End Date Taking? Authorizing Provider  aspirin 81 MG tablet Take 81 mg by mouth daily.    [provider]  busPIRone (BUSPAR) 15 MG tablet Take 1 tablet by mouth 2 (two) times daily. 12/21/16   [provider]  clonazePAM (KLONOPIN) 0.5 MG tablet Take 1 tablet by mouth daily. 12/21/16   [provider]  clonazePAM (KLONOPIN) 1 MG tablet Take 1 tablet (1 mg total) by mouth 2 (two) times daily as needed for anxiety. 12/14/17 12/14/18  Earleen Newport, MD  FLUoxetine (PROZAC) 20 MG tablet Take 1 tablet (20 mg total) by mouth at bedtime. 12/27/16   Paulette Blanch, MD  naproxen (NAPROSYN) 500 MG tablet Take 1 tablet (500 mg total) by mouth 2 (two) times daily with a meal. 03/13/17   Carrie Mew, MD  OLANZapine [facility]) 2.5 MG  tablet 1/2 tablet every night 12/27/16   Paulette Blanch, MD  sertraline (ZOLOFT) 25 MG tablet Take 2 tablets by mouth at bedtime.  12/21/16   [provider]  sertraline (ZOLOFT) 50 MG tablet Take 1 tablet by mouth daily. 12/21/16   [provider]    Allergies  Allergen Reactions  . Shellfish Allergy   . Tylenol [Acetaminophen] Hives    No family history on file.  Social History Social History   Tobacco Use  . Smoking status: Current Every Day Smoker    Packs/day: 1.00    Types: Cigarettes  . Smokeless tobacco: Never Used  Substance Use Topics  . Alcohol use: No  . Drug use: Not on file    Review of Systems Constitutional: Negative for fever. Cardiovascular: Negative for chest pain. Respiratory: Negative for shortness of breath. Gastrointestinal: Positive for lower abdominal pain intermittent.  Negative for vomiting.  Positive diarrhea several days ago. Genitourinary: Negative for dysuria or hematuria.  Menstrual period ended 1.5 weeks ago.  Denies any vaginal discharge. Musculoskeletal: Negative for musculoskeletal complaints Skin: Negative for skin complaints  Neurological: Negative for headache All other ROS negative  ____________________________________________   PHYSICAL EXAM:  VITAL SIGNS: ED Triage Vitals  Enc Vitals Group     BP 08/24/19 1051 139/75     Pulse Rate 08/24/19 1051 67     Resp 08/24/19 1051 16  Temp 08/24/19 1051 98.2 F (36.8 C)     Temp Source 08/24/19 1051 Oral     SpO2 08/24/19 1051 100 %     Weight --      Height --      Head Circumference --      Peak Flow --      Pain Score 08/24/19 1055 5     Pain Loc --      Pain Edu? --      Excl. in [place]? --    Constitutional: Alert and oriented. Well appearing and in no distress. Eyes: Normal exam ENT      Head: Normocephalic and atraumatic.      Mouth/Throat: Mucous membranes are moist. Cardiovascular: Normal rate, regular rhythm.  Respiratory: Normal respiratory  effort without tachypnea nor retractions. Breath sounds are clear  Gastrointestinal: Soft, mild suprapubic and left lower quadrant tenderness.  Minimal right lower quadrant tenderness.  No tenderness over McBurney's point, tenderness appears to be more in the suprapubic area.  No rebound guarding or distention. Musculoskeletal: Nontender with normal range of motion in all extremities.  Neurologic:  Normal speech and language. No gross focal neurologic deficits  Skin:  Skin is warm, dry and intact.  Psychiatric: Mood and affect are normal.  ____________________________________________   RADIOLOGY  Ultrasound is negative.  ____________________________________________   INITIAL IMPRESSION / ASSESSMENT AND PLAN / ED COURSE  Pertinent labs & imaging results that were available during my care of the patient were reviewed by me and considered in my medical decision making (see chart for details).   Patient presents emergency department for lower abdominal pain for the past 5 or 6 days.  Differential would include UTI, pyelonephritis, ovarian cysts, colitis or diverticulitis.  Reassuringly patient's lab work is largely within normal limits including a normal white blood cell count.  Urinalysis appears to be normal as well.  Tenderness is mild on and is mostly along the very low abdomen/suprapubic area.  No focal tenderness at McBurney's point.  Patient does state a history of ovarian cysts in the past, states this does feel somewhat similar.  We will obtain an ultrasound of the pelvis to further evaluate and continue to closely monitor.  Ultrasound negative.  Patient states pain has resolved following Bentyl.  We will discharge with short course of Bentyl.  I discussed with the patient return to the emergency department for any return of/worsening abdominal pain or development of fever to proceed with CT imaging at that time.  At this time given reassuring labs are reassuring exam we will hold off on  CT imaging at this time.  Patient agreeable.  Robin Saunders was evaluated in Emergency Department on 08/24/2019 for the symptoms described in the history of present illness. She was evaluated in the context of the global COVID-19 pandemic, which necessitated consideration that the patient might be at risk for infection with the SARS-CoV-2 virus that causes COVID-19. Institutional protocols and algorithms that pertain to the evaluation of patients at risk for COVID-19 are in a state of rapid change based on information released by regulatory bodies including the CDC and federal and state organizations. These policies and algorithms were followed during the patient's care in the ED.  ____________________________________________   FINAL CLINICAL IMPRESSION(S) / ED DIAGNOSES  Lower abdominal pain   Harvest Dark, MD 08/24/19 1256

## 2019-08-24 NOTE — ED Notes (Signed)
Pt transported to US via stretcher.  

## 2019-08-24 NOTE — ED Notes (Signed)
Pt given gown and warm blanket, informed to undress from waist down for [place].

## 2020-04-06 ENCOUNTER — Other Ambulatory Visit: Payer: Self-pay | Admitting: Family Medicine

## 2020-04-06 DIAGNOSIS — Z1231 Encounter for screening mammogram for malignant neoplasm of breast: Secondary | ICD-10-CM

## 2020-04-15 ENCOUNTER — Ambulatory Visit
Admission: RE | Admit: 2020-04-15 | Discharge: 2020-04-15 | Disposition: A | Discharge status: Home / Self Care | Payer: BC Managed Care – PPO | Source: Ambulatory Visit | Attending: Family Medicine | Admitting: Family Medicine

## 2020-04-15 DIAGNOSIS — Z1231 Encounter for screening mammogram for malignant neoplasm of breast: Secondary | ICD-10-CM | POA: Diagnosis not present

## 2020-09-05 ENCOUNTER — Ambulatory Visit
Admission: EM | Admit: 2020-09-05 | Discharge: 2020-09-05 | Disposition: A | Discharge status: Home / Self Care | Payer: BC Managed Care – PPO | Attending: Physician Assistant | Admitting: Physician Assistant

## 2020-09-05 ENCOUNTER — Other Ambulatory Visit: Payer: Self-pay

## 2020-09-05 DIAGNOSIS — R079 Chest pain, unspecified: Secondary | ICD-10-CM

## 2020-09-05 MED ORDER — ASPIRIN 81 MG PO CHEW
324.0000 mg | CHEWABLE_TABLET | Freq: Once | ORAL | Status: AC
  Administered 2020-09-05: 324 mg via ORAL

## 2020-09-05 NOTE — Discharge Instructions (Addendum)
Given your symptoms you need to have a further work up in the emergency department to ensure this is not your heart.  Please report to the Emergency department for further evaluation

## 2020-09-05 NOTE — ED Triage Notes (Signed)
Patient complains of center of chest pain that started around 715am, states that it improved and came back around 1030am. States that she has noticed some left arm pain and tingling. Patient states that she is under stress and was at work this started.

## 2020-09-05 NOTE — ED Provider Notes (Signed)
[facility]    CSN: [id number] Arrival date & time: 09/05/20  1109      History   Chief Complaint Chief Complaint  Patient presents with  . Chest Pain    HPI Robin Saunders is a 39 y.o. female.   Patient presents for chest pain.  She reports she has had 2 episodes of chest pain today and has active chest pain in clinic currently.  She reports this morning at work she had sharp and aching central chest pain lasted close to an hour and then resolved.  She reports few hours later central chest pain returned.  She describes sharp pain as well as aching pressure.  She reports she felt like her heart was beating faster when this occurred.  She had a colleague at work check her pulse ox and was noted to be 120 bpm.  She did not have any shortness of breath when this occurred.  No nausea or sweating.  Patient left work to be evaluated initially going to the emergency department however so wait times and was going to go to a local urgent care.  She notes when she was walking up a ramp to the urgent care she began having pain in the left arm.  She describes tingling sensation down the left arm as well as numbness in her left arm.  She reports she has continued to have the central chest pain with the left arm pain and tingling.  She denies shortness of breath in clinic.  Nothing is seem to make the pain better or worse.  Patient's not sure if deep breaths change the pain as a whole.  She has not tried any foods today.  She has not had any lower extremity swelling.  Denies abdominal pain.  Denies headache, dizziness or lightheaded sensation.  Patient is a smoker.  She is a nondiabetic.  No issues with blood pressure in the past.  Primary care has been watching her cholesterol.  She reports family history of heart attacks.  She reports her father was in his 4s when he had a heart attack and passed away.  Mother also had a heart attack in her late 65s.  Patient does endorse a history of anxiety  and has had similar pains in the past but did not have the left arm pain and sensation.  She is worried that this may be her heart.     Past Medical History:  Diagnosis Date  . Anxiety   . Cyst, ovarian   . High cholesterol     There are no problems to display for this patient.   Past Surgical History:  Procedure Laterality Date  . 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 BREAST BIOPSY Left 03/14/2018   1:00 wing fibroadeoma  . CHOLECYSTECTOMY      OB History   No obstetric history on file.      Home Medications    Prior to Admission medications   Medication Sig Start Date End Date Taking? Authorizing Provider  aspirin 81 MG tablet Take 81 mg by mouth daily.    [provider]  busPIRone (BUSPAR) 15 MG tablet Take 1 tablet by mouth 2 (two) times daily. 12/21/16   [provider]  clonazePAM (KLONOPIN) 0.5 MG tablet Take 1 tablet by mouth daily. 12/21/16   [provider]  dicyclomine (BENTYL) 20 MG tablet Take 1 tablet (20 mg total)  by mouth 3 (three) times daily as needed for spasms. 08/24/19 08/23/20  Harvest Dark, MD  sertraline (ZOLOFT) 25 MG tablet Take 2 tablets by mouth at bedtime.  12/21/16   [provider]  sertraline (ZOLOFT) 50 MG tablet Take 1 tablet by mouth daily. 12/21/16   [provider]    Family History Family History  Problem Relation Age of Onset  . Cancer Mother   . Heart attack Mother   . Heart attack Father     Social History Social History   Tobacco Use  . Smoking status: Current Every Day Smoker    Packs/day: 1.00    Types: Cigarettes  . Smokeless tobacco: Never Used  Vaping Use  . Vaping Use: Never used  Substance Use Topics  . Alcohol use: No  . Drug use: Never     Allergies   Shellfish allergy and Tylenol [acetaminophen]   Review of Systems Review of Systems   Physical Exam Triage Vital  Signs ED Triage Vitals  Enc Vitals Group     BP      Pulse      Resp      Temp      Temp src      SpO2      Weight      Height      Head Circumference      Peak Flow      Pain Score      Pain Loc      Pain Edu?      Excl. in [place]?    No data found.  Updated Vital Signs BP 119/76 (BP Location: Right Arm)   Pulse 68   Temp 98.3 F (36.8 C) (Oral)   Resp 16   Ht 5\' 6"  (1.676 m)   Wt 195 lb (88.5 kg)   SpO2 100%   BMI 31.47 kg/m   Visual Acuity Right Eye Distance:   Left Eye Distance:   Bilateral Distance:    Right Eye Near:   Left Eye Near:    Bilateral Near:     Physical Exam Vitals and nursing note reviewed.  Constitutional:      General: She is not in acute distress.    Appearance: She is well-developed. She is not ill-appearing.  HENT:     Head: Normocephalic and atraumatic.  Eyes:     Conjunctiva/sclera: Conjunctivae normal.  Cardiovascular:     Rate and Rhythm: Normal rate and regular rhythm.     Heart sounds: No murmur heard.   Pulmonary:     Effort: Pulmonary effort is normal. No respiratory distress.     Breath sounds: Normal breath sounds. No decreased breath sounds, wheezing, rhonchi or rales.  Abdominal:     Palpations: Abdomen is soft.     Tenderness: There is no abdominal tenderness.  Musculoskeletal:     Cervical back: Normal range of motion and neck supple.  Skin:    General: Skin is warm and dry.  Neurological:     Mental Status: She is alert.      UC Treatments / Results  Labs (all labs ordered are listed, but only abnormal results are displayed) Labs Reviewed - No data to display  EKG Normal sinus rhythm compared to last.  Normal EKG  Radiology No results found.  Procedures Procedures (including critical care time)  Medications Ordered in UC Medications  aspirin chewable tablet 324 mg (324 mg Oral Given 09/05/20 1203)    Initial Impression / Assessment and  Plan / UC Course  I have reviewed the triage vital signs  and the nursing notes.  Pertinent labs & imaging results that were available during my care of the patient were reviewed by me and considered in my medical decision making (see chart for details).     #Chest pain Patient is a 39 year old presenting with active chest pain with radiation in the left arm.  Normal EKG here.  However given chest pain with some radiation to left arm and significant family history and active smoker with continued chest pain in clinic believe she needs ACS rule out.  Patient is quite anxious about this being her heart discussed with her to ensure this is not her heart she should be further evaluated in the emergency department today.  She is agreeable to this plan.  Patient was brought here by family member.  Will give aspirin here and discharged with self transport to emergency department as she has stable vital signs.  Patient was agreeable to the plan of care. Final Clinical Impressions(s) / UC Diagnoses   Final diagnoses:  Chest pain, unspecified type     Discharge Instructions     Given your symptoms you need to have a further work up in the emergency department to ensure this is not your heart.  Please report to the Emergency department for further evaluation    ED Prescriptions    None     PDMP not reviewed this encounter.   Purnell Shoemaker, PA-C 09/05/20 1226

## 2020-09-06 ENCOUNTER — Emergency Department
Admission: EM | Admit: 2020-09-06 | Discharge: 2020-09-07 | Disposition: A | Discharge status: Home / Self Care | Payer: BC Managed Care – PPO | Attending: Emergency Medicine | Admitting: Emergency Medicine

## 2020-09-06 ENCOUNTER — Other Ambulatory Visit: Payer: Self-pay

## 2020-09-06 ENCOUNTER — Emergency Department: Payer: BC Managed Care – PPO

## 2020-09-06 DIAGNOSIS — Z79899 Other long term (current) drug therapy: Secondary | ICD-10-CM | POA: Diagnosis not present

## 2020-09-06 DIAGNOSIS — F17210 Nicotine dependence, cigarettes, uncomplicated: Secondary | ICD-10-CM | POA: Insufficient documentation

## 2020-09-06 DIAGNOSIS — R079 Chest pain, unspecified: Secondary | ICD-10-CM | POA: Diagnosis present

## 2020-09-06 LAB — BASIC METABOLIC PANEL
Anion gap: 8 (ref 5–15)
BUN: 9 mg/dL (ref 6–20)
CO2: 24 mmol/L (ref 22–32)
Calcium: 8.8 mg/dL — ABNORMAL LOW (ref 8.9–10.3)
Chloride: 107 mmol/L (ref 98–111)
Creatinine, Ser: 0.69 mg/dL (ref 0.44–1.00)
GFR calc Af Amer: >60 mL/min (ref >60)
GFR calc non Af Amer: >60 mL/min (ref >60)
Glucose, Bld: 89 mg/dL (ref 70–99)
Potassium: 3.7 mmol/L (ref 3.5–5.1)
Sodium: 139 mmol/L (ref 135–145)

## 2020-09-06 LAB — CBC
HCT: 38.6 % (ref 36.0–46.0)
Hemoglobin: 13.5 g/dL (ref 12.0–15.0)
MCH: 29.2 pg (ref 26.0–34.0)
MCHC: 35 g/dL (ref 30.0–36.0)
MCV: 83.4 fL (ref 80.0–100.0)
Platelets: 303 10*3/uL (ref 150–400)
RBC: 4.63 MIL/uL (ref 3.87–5.11)
RDW: 14.4 % (ref 11.5–15.5)
WBC: 9.8 10*3/uL (ref 4.0–10.5)
nRBC: 0 % (ref 0.0–0.2)

## 2020-09-06 LAB — TROPONIN I (HIGH SENSITIVITY): Troponin I (High Sensitivity): 3 ng/L (ref <18)

## 2020-09-06 NOTE — ED Triage Notes (Signed)
Patient c/o medial chest pain. Evaluated yesterday in this ED for same

## 2020-09-07 LAB — TROPONIN I (HIGH SENSITIVITY): Troponin I (High Sensitivity): <2 ng/L (ref <18)

## 2020-09-07 MED ORDER — LORAZEPAM 0.5 MG PO TABS
0.5000 mg | ORAL_TABLET | Freq: Once | ORAL | Status: DC
  Filled 2020-09-07: qty 1

## 2020-09-07 NOTE — ED Provider Notes (Signed)
[facility] Emergency Department Provider Note   ____________________________________________   None    (approximate)  I have reviewed the triage vital signs and the nursing notes.   HISTORY  Chief Complaint Chest Pain    HPI TIMIYAH ROMITO is a 39 y.o. female with past medical history of hyperlipidemia and anxiety presents to the ED complaining of chest pain.  Patient reports she has been having intermittent sharp pain with tightness in her chest over the past couple of days.  When the pain is severe it will feel like she is having difficulty breathing and she has also had some numbness and tingling in her extremities.  She denies any fevers, cough, vomiting, or diarrhea.  She was seen in the ED in [facility] for similar symptoms yesterday with unremarkable work-up and has follow-up with her PCP scheduled tomorrow, but states symptoms got worse overnight and she wanted to be reevaluated.  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        Past Medical History:  Diagnosis Date  . Anxiety   . Cyst, ovarian   . High cholesterol     There are no problems to display for this patient.   Past Surgical History:  Procedure Laterality Date  . 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 BREAST BIOPSY Left 03/14/2018   1:00 wing fibroadeoma  . CHOLECYSTECTOMY      Prior to Admission medications   Medication Sig Start Date End Date Taking? Authorizing Provider  aspirin 81 MG tablet Take 81 mg by mouth daily.    [provider]  busPIRone (BUSPAR) 15 MG tablet Take 1 tablet by mouth 2 (two) times daily. 12/21/16   [provider]  clonazePAM (KLONOPIN) 0.5 MG tablet Take 1 tablet by mouth daily. 12/21/16   [provider]  dicyclomine (BENTYL) 20 MG tablet Take 1  tablet (20 mg total) by mouth 3 (three) times daily as needed for spasms. 08/24/19 08/23/20  Harvest Dark, MD  sertraline (ZOLOFT) 25 MG tablet Take 2 tablets by mouth at bedtime.  12/21/16   [provider]  sertraline (ZOLOFT) 50 MG tablet Take 1 tablet by mouth daily. 12/21/16   [provider]    Allergies Shellfish allergy and Tylenol [acetaminophen]  Family History  Problem Relation Age of Onset  . Cancer Mother   . Heart attack Mother   . Heart attack Father     Social History Social History   Tobacco Use  . Smoking status: Current Every Day Smoker    Packs/day: 1.00    Types: Cigarettes  . Smokeless tobacco: Never Used  Vaping Use  . Vaping Use: Never used  Substance Use Topics  . Alcohol use: No  . Drug use: Never    Review of Systems  Constitutional: No fever/chills Eyes: No visual changes. ENT: No sore throat. Cardiovascular: Positive for chest pain. Respiratory: Positive for shortness of breath. Gastrointestinal: No abdominal pain.  No nausea, no vomiting.  No diarrhea.  No constipation. Genitourinary: Negative for dysuria. Musculoskeletal: Negative for back pain. Skin: Negative for rash. Neurological: Negative for headaches, focal weakness or numbness.  ____________________________________________   PHYSICAL EXAM:  VITAL SIGNS: ED Triage Vitals [09/06/20 2039]  Enc Vitals Group     BP (!) 146/79  Pulse Rate 72     Resp 15     Temp 98.6 F (37 C)     Temp Source Oral     SpO2 100 %     Weight 195 lb 1.7 oz (88.5 kg)     Height 5\' 6"  (1.676 m)     Head Circumference      Peak Flow      Pain Score 8     Pain Loc      Pain Edu?      Excl. in [place]?     Constitutional: Alert and oriented. Eyes: Conjunctivae are normal. Head: Atraumatic. Nose: No congestion/rhinnorhea. Mouth/Throat: Mucous membranes are moist. Neck: Normal ROM Cardiovascular: Normal rate, regular rhythm. Grossly normal heart sounds. Respiratory:  Normal respiratory effort.  No retractions. Lungs CTAB. Gastrointestinal: Soft and nontender. No distention. Genitourinary: deferred Musculoskeletal: No lower extremity tenderness nor edema. Neurologic:  Normal speech and language. No gross focal neurologic deficits are appreciated. Skin:  Skin is warm, dry and intact. No rash noted. Psychiatric: Mood and affect are normal. Speech and behavior are normal.  ____________________________________________   LABS (all labs ordered are listed, but only abnormal results are displayed)  Labs Reviewed  BASIC METABOLIC PANEL - Abnormal; Notable for the following components:      Result Value   Calcium 8.8 (*)    All other components within normal limits  CBC  POC URINE PREG, ED  TROPONIN I (HIGH SENSITIVITY)  TROPONIN I (HIGH SENSITIVITY)   ____________________________________________  EKG  ED ECG REPORT I, Blake Divine, the attending physician, personally viewed and interpreted this ECG.   Date: 09/07/2020  EKG Time: 20:31  Rate: 66  Rhythm: normal sinus rhythm  Axis: Normal  Intervals:none  ST&T Change: None   PROCEDURES  Procedure(s) performed (including Critical Care):  Procedures   ____________________________________________   INITIAL IMPRESSION / ASSESSMENT AND PLAN / ED COURSE       39 year old female with past medical history of hyperlipidemia and anxiety who presents to the ED complaining of intermittent sharp and tight chest pain over the past couple of days associated with some difficulty catching her breath as well as numbness and tingling in her arms.  Work-up completed from triage is unremarkable including EKG and 2 sets of troponin.  Given her heart score of less than 4, very low suspicion for ACS as the etiology of her symptoms.  I also doubt PE given her intermittent symptoms and reassuring vital signs.  Chest x-ray is unremarkable and remainder of labs reassuring.  At this point, I suspect her symptoms  are related to stress and she would be appropriate for discharge home with PCP follow-up.  Patient counseled to return to the ED for new worsening symptoms, patient agrees with plan.      ____________________________________________   FINAL CLINICAL IMPRESSION(S) / ED DIAGNOSES  Final diagnoses:  Nonspecific chest pain     ED Discharge Orders    None       Note:  This document was prepared using Dragon voice recognition software and may include unintentional dictation errors.   Blake Divine, MD 09/07/20 [phone]

## 2020-09-24 ENCOUNTER — Other Ambulatory Visit
Admission: RE | Admit: 2020-09-24 | Discharge: 2020-09-24 | Disposition: A | Discharge status: Home / Self Care | Payer: BC Managed Care – PPO | Source: Ambulatory Visit | Attending: Internal Medicine | Admitting: Internal Medicine

## 2020-09-24 DIAGNOSIS — R002 Palpitations: Secondary | ICD-10-CM | POA: Insufficient documentation

## 2020-09-24 LAB — FIBRIN DERIVATIVES D-DIMER (ARMC ONLY): Fibrin derivatives D-dimer (ARMC): 290.15 ng/mL (FEU) (ref 0.00–499.00)

## 2021-05-18 ENCOUNTER — Other Ambulatory Visit: Payer: Self-pay | Admitting: Family Medicine

## 2021-05-18 DIAGNOSIS — Z1231 Encounter for screening mammogram for malignant neoplasm of breast: Secondary | ICD-10-CM

## 2021-05-27 ENCOUNTER — Other Ambulatory Visit: Payer: Self-pay

## 2021-05-27 ENCOUNTER — Ambulatory Visit
Admission: RE | Admit: 2021-05-27 | Discharge: 2021-05-27 | Disposition: A | Discharge status: Home / Self Care | Payer: BC Managed Care – PPO | Source: Ambulatory Visit | Attending: Family Medicine | Admitting: Family Medicine

## 2021-05-27 DIAGNOSIS — Z1231 Encounter for screening mammogram for malignant neoplasm of breast: Secondary | ICD-10-CM | POA: Insufficient documentation

## 2022-05-17 ENCOUNTER — Other Ambulatory Visit: Payer: Self-pay | Admitting: Family Medicine

## 2022-05-17 DIAGNOSIS — Z1231 Encounter for screening mammogram for malignant neoplasm of breast: Secondary | ICD-10-CM

## 2022-06-03 ENCOUNTER — Ambulatory Visit
Admission: RE | Admit: 2022-06-03 | Discharge: 2022-06-03 | Disposition: A | Discharge status: Home / Self Care | Payer: BC Managed Care – PPO | Source: Ambulatory Visit | Attending: Family Medicine | Admitting: Family Medicine

## 2022-06-03 DIAGNOSIS — Z1231 Encounter for screening mammogram for malignant neoplasm of breast: Secondary | ICD-10-CM | POA: Diagnosis present

## 2022-06-17 ENCOUNTER — Other Ambulatory Visit: Payer: Self-pay | Admitting: Family Medicine

## 2022-06-17 DIAGNOSIS — N63 Unspecified lump in unspecified breast: Secondary | ICD-10-CM

## 2022-06-17 DIAGNOSIS — R928 Other abnormal and inconclusive findings on diagnostic imaging of breast: Secondary | ICD-10-CM

## 2022-06-21 ENCOUNTER — Ambulatory Visit
Admission: RE | Admit: 2022-06-21 | Discharge: 2022-06-21 | Disposition: A | Discharge status: Home / Self Care | Payer: BC Managed Care – PPO | Source: Ambulatory Visit | Attending: Family Medicine | Admitting: Family Medicine

## 2022-06-21 DIAGNOSIS — R928 Other abnormal and inconclusive findings on diagnostic imaging of breast: Secondary | ICD-10-CM

## 2022-06-21 DIAGNOSIS — N63 Unspecified lump in unspecified breast: Secondary | ICD-10-CM | POA: Diagnosis present

## 2022-06-24 ENCOUNTER — Other Ambulatory Visit: Payer: Self-pay | Admitting: Family Medicine

## 2022-06-24 DIAGNOSIS — R928 Other abnormal and inconclusive findings on diagnostic imaging of breast: Secondary | ICD-10-CM

## 2022-06-24 DIAGNOSIS — N63 Unspecified lump in unspecified breast: Secondary | ICD-10-CM

## 2022-07-14 ENCOUNTER — Ambulatory Visit
Admission: RE | Admit: 2022-07-14 | Discharge: 2022-07-14 | Disposition: A | Discharge status: Home / Self Care | Payer: BC Managed Care – PPO | Source: Ambulatory Visit | Attending: Family Medicine | Admitting: Family Medicine

## 2022-07-14 ENCOUNTER — Other Ambulatory Visit: Payer: Self-pay | Admitting: Family Medicine

## 2022-07-14 DIAGNOSIS — R928 Other abnormal and inconclusive findings on diagnostic imaging of breast: Secondary | ICD-10-CM

## 2022-07-14 DIAGNOSIS — N63 Unspecified lump in unspecified breast: Secondary | ICD-10-CM

## 2022-07-14 HISTORY — PX: BREAST BIOPSY: SHX20

## 2022-07-15 LAB — SURGICAL PATHOLOGY

## 2022-11-09 ENCOUNTER — Other Ambulatory Visit: Payer: Self-pay | Admitting: Internal Medicine

## 2022-11-09 DIAGNOSIS — R079 Chest pain, unspecified: Secondary | ICD-10-CM

## 2022-11-25 ENCOUNTER — Telehealth (HOSPITAL_COMMUNITY): Payer: Self-pay | Admitting: *Deleted

## 2022-11-25 ENCOUNTER — Other Ambulatory Visit (HOSPITAL_COMMUNITY): Payer: Self-pay | Admitting: *Deleted

## 2022-11-25 ENCOUNTER — Encounter (HOSPITAL_COMMUNITY): Payer: Self-pay

## 2022-11-25 MED ORDER — METOPROLOL TARTRATE 100 MG PO TABS: ORAL_TABLET | ORAL | 0 refills | Status: DC

## 2022-11-25 NOTE — Telephone Encounter (Signed)
Reaching out to patient to offer assistance regarding upcoming cardiac imaging study; pt verbalizes understanding of appt date/time, parking situation and where to check in, pre-test NPO status and medications ordered, and verified current allergies; name and call back number provided for further questions should they arise ? ?Marriana Hibberd RN Navigator Cardiac Imaging ?[facility] ?[phone] office ?[phone] cell ? ?Patient to take 100mg metoprolol tartrate two hours prior to her cardiac CT scan.  ?

## 2022-11-28 ENCOUNTER — Ambulatory Visit
Admission: RE | Admit: 2022-11-28 | Discharge: 2022-11-28 | Disposition: A | Discharge status: Home / Self Care | Payer: BC Managed Care – PPO | Source: Ambulatory Visit | Attending: Internal Medicine | Admitting: Internal Medicine

## 2022-11-28 DIAGNOSIS — R079 Chest pain, unspecified: Secondary | ICD-10-CM | POA: Insufficient documentation

## 2022-11-28 MED ORDER — IOHEXOL 350 MG/ML SOLN
100.0000 mL | Freq: Once | INTRAVENOUS | Status: AC | PRN
  Administered 2022-11-28: 100 mL via INTRAVENOUS

## 2022-11-28 MED ORDER — METOPROLOL TARTRATE 5 MG/5ML IV SOLN: 10.0000 mg | Freq: Once | INTRAVENOUS | Status: AC

## 2022-11-28 MED ORDER — NITROGLYCERIN 0.4 MG SL SUBL
0.8000 mg | SUBLINGUAL_TABLET | Freq: Once | SUBLINGUAL | Status: AC
  Administered 2022-11-28: 0.8 mg via SUBLINGUAL

## 2022-11-28 MED ORDER — METOPROLOL TARTRATE 5 MG/5ML IV SOLN
INTRAVENOUS | Status: AC
  Administered 2022-11-28: 10 mg via INTRAVENOUS
  Filled 2022-11-28: qty 10

## 2022-11-28 NOTE — Progress Notes (Signed)
Patient tolerated procedure well. Ambulate w/o difficulty. Denies any lightheadedness or being dizzy. Pt denies any pain at this time. Sitting in chair, pt is encouraged to drink additional water throughout the day and reason explained to patient. Patient verbalized understanding and all questions answered. ABC intact. No further needs at this time. Discharge from procedure area w/o issues.  

## 2023-05-18 ENCOUNTER — Other Ambulatory Visit: Payer: Self-pay | Admitting: Family Medicine

## 2023-05-18 DIAGNOSIS — Z1231 Encounter for screening mammogram for malignant neoplasm of breast: Secondary | ICD-10-CM

## 2023-07-05 ENCOUNTER — Ambulatory Visit
Admission: RE | Admit: 2023-07-05 | Discharge: 2023-07-05 | Disposition: A | Discharge status: Home / Self Care | Payer: BC Managed Care – PPO | Source: Ambulatory Visit | Attending: Family Medicine | Admitting: Family Medicine

## 2023-07-05 DIAGNOSIS — Z1231 Encounter for screening mammogram for malignant neoplasm of breast: Secondary | ICD-10-CM | POA: Diagnosis present

## 2023-07-11 ENCOUNTER — Other Ambulatory Visit: Payer: Self-pay | Admitting: Family Medicine

## 2023-07-11 DIAGNOSIS — N63 Unspecified lump in unspecified breast: Secondary | ICD-10-CM

## 2023-07-11 DIAGNOSIS — R928 Other abnormal and inconclusive findings on diagnostic imaging of breast: Secondary | ICD-10-CM

## 2023-07-14 ENCOUNTER — Ambulatory Visit
Admission: RE | Admit: 2023-07-14 | Discharge: 2023-07-14 | Disposition: A | Discharge status: Home / Self Care | Payer: BC Managed Care – PPO | Source: Ambulatory Visit | Attending: Family Medicine | Admitting: Family Medicine

## 2023-07-14 DIAGNOSIS — R928 Other abnormal and inconclusive findings on diagnostic imaging of breast: Secondary | ICD-10-CM | POA: Diagnosis not present

## 2023-07-14 DIAGNOSIS — N63 Unspecified lump in unspecified breast: Secondary | ICD-10-CM | POA: Diagnosis present

## 2023-07-20 ENCOUNTER — Encounter: Payer: Self-pay | Admitting: *Deleted

## 2023-07-20 DIAGNOSIS — D241 Benign neoplasm of right breast: Secondary | ICD-10-CM

## 2023-07-20 NOTE — Progress Notes (Signed)
Referral recieved from [facility] Radiology for benign breast mass.  Referral placed to Dr. Claudine Mouton per patient request.  No further needs at this time.

## 2023-07-26 NOTE — Progress Notes (Unsigned)
Patient ID: Robin Saunders, female   DOB: 04/05/1981, 42 y.o.   MRN: [id number]  Chief Complaint: Fibroadenoma right breast  History of Present Illness Robin Saunders is a 42 y.o. female with history of multiple fibroadenomas and core biopsies.  Last biopsy was obtained July 2023.  Follow-up ultrasound imaging, prompted surgical evaluation for a slight increase over the year and size of the readily appreciated medial right breast fibroadenoma.  She is not extremely concerned or worried about the etiology of this lesion despite its slight change in size.  She is expressed a desire to avoid surgery, I believe it may be reasonable to continue to follow this with imaging at the present time.  Past Medical History Past Medical History:  Diagnosis Date   Anxiety    Cyst, ovarian    High cholesterol       Past Surgical History:  Procedure Laterality Date   BREAST BIOPSY Right 03/14/2018   10:30 coil fibroadeoma   BREAST BIOPSY Left 03/14/2018   12:00 ribbon fibroadeoma   BREAST BIOPSY Left 03/14/2018   1:00 wing fibroadeoma   BREAST BIOPSY Right 07/14/2022   [place] Bx, 3:00 5 CMFN, Heart Clip, path pending   BREAST BIOPSY Right 07/14/2022   [place] Bx, 8:00 4CMFN, Venus Clip,path pending   CHOLECYSTECTOMY      Allergies  Allergen Reactions   Shellfish Allergy    Tylenol [Acetaminophen] Hives    Current Outpatient Medications  Medication Sig Dispense Refill   atorvastatin (LIPITOR) 10 MG tablet Take 10 mg by mouth daily.     busPIRone (BUSPAR) 15 MG tablet Take 1 tablet by mouth 2 (two) times daily.     FLUoxetine (PROZAC) 20 MG capsule Take 20 mg by mouth daily.     aspirin 81 MG tablet Take 81 mg by mouth daily.     clonazePAM (KLONOPIN) 0.5 MG tablet Take 1 tablet by mouth daily.     dicyclomine (BENTYL) 20 MG tablet Take 1 tablet (20 mg total) by mouth 3 (three) times daily as needed for spasms. 30 tablet 0   metoprolol tartrate (LOPRESSOR) 100 MG tablet Take tablet (100mg ) TWO hours  prior to your cardiac CT scan. 1 tablet 0   sertraline (ZOLOFT) 25 MG tablet Take 2 tablets by mouth at bedtime.      sertraline (ZOLOFT) 50 MG tablet Take 1 tablet by mouth daily.  0   No current facility-administered medications for this visit.    Family History Family History  Problem Relation Age of Onset   Cancer Mother    Heart attack Mother    Heart attack Father    Breast cancer Neg Hx       Social History Social History   Tobacco Use   Smoking status: Every Day    Current packs/day: 1.00    Types: Cigarettes   Smokeless tobacco: Never  Vaping Use   Vaping status: Never Used  Substance Use Topics   Alcohol use: No   Drug use: Never        Review of Systems  Constitutional: Negative.   HENT: Negative.    Eyes: Negative.   Respiratory: Negative.    Cardiovascular: Negative.   Gastrointestinal: Negative.   Genitourinary: Negative.   Skin: Negative.   Neurological: Negative.   Psychiatric/Behavioral: Negative.       Physical Exam Blood pressure (!) 144/84, pulse 74, temperature 98.5 F (36.9 C), height 5\' 6"  (1.676 m), weight 190 lb 9.6 oz (86.5 kg), SpO2 99%.  Last Weight  Most recent update: 07/27/2023 11:33 AM    Weight  86.5 kg (190 lb 9.6 oz)             CONSTITUTIONAL: Well developed, and nourished, appropriately responsive and aware without distress.   EYES: Sclera non-icteric.   EARS, NOSE, MOUTH AND THROAT:  The oropharynx is clear. Oral mucosa is pink and moist.     Hearing is intact to voice.  NECK: Trachea is midline, and there is no jugular venous distension.  LYMPH NODES:  Lymph nodes in the neck are not appreciated. RESPIRATORY:  Lungs are clear, and breath sounds are equal bilaterally.  Normal respiratory effort without pathologic use of accessory muscles. CARDIOVASCULAR: Heart is regular in rate and rhythm.   Well perfused.  GI: The abdomen is  soft, nontender, and nondistended. There were no palpable masses.  GU: [name] was  present as chaperone.  Most notable was the readily palpable and will defined/discrete mass consistent with the 1 of concern in the central medial right breast.  This is consistent with a fibroadenoma, had been previously biopsied as such.  And considering the patient's premenopausal status would allow for some degree of growth of these lesions.  There were no other suspicious or dominant findings in the breast aside from small discrete lesions that I patient is very familiar with.  There are no adjacent skin changes, edema, nipple discharge present. MUSCULOSKELETAL:  Symmetrical muscle tone appreciated in all four extremities.    SKIN: Skin turgor is normal. No pathologic skin lesions appreciated.  NEUROLOGIC:  Motor and sensation appear grossly normal.  Cranial nerves are grossly without defect. PSYCH:  Alert and oriented to person, place and time. Affect is appropriate for situation.  Data Reviewed I have personally reviewed what is currently available of the patient's imaging, recent labs and medical records.   Labs:     Latest Ref Rng & Units 09/06/2020    8:49 PM 08/24/2019   10:58 AM 05/08/2018   12:21 PM  CBC  WBC 4.0 - 10.5 K/uL 9.8  10.3  11.3   Hemoglobin 12.0 - 15.0 g/dL 16.1  09.6  04.5   Hematocrit 36.0 - 46.0 % 38.6  44.8  38.6   Platelets 150 - 400 K/uL 303  309  310       Latest Ref Rng & Units 09/06/2020    8:49 PM 08/24/2019   10:58 AM 05/08/2018   12:21 PM  CMP  Glucose 70 - 99 mg/dL 89  89  92   BUN 6 - 20 mg/dL 9  5  7    Creatinine 0.44 - 1.00 mg/dL 4.09  8.11  9.14   Sodium 135 - 145 mmol/L 139  136  136   Potassium 3.5 - 5.1 mmol/L 3.7  4.0  3.4   Chloride 98 - 111 mmol/L 107  105  103   CO2 22 - 32 mmol/L 24  23  25    Calcium 8.9 - 10.3 mg/dL 8.8  9.1  9.0   Total Protein 6.5 - 8.1 g/dL  7.5  8.0   Total Bilirubin 0.3 - 1.2 mg/dL  0.7  1.0   Alkaline Phos 38 - 126 U/L  42  56   AST 15 - 41 U/L  16  15   ALT 0 - 44 U/L  12  13    SURGICAL PATHOLOGY  CASE:  [id number]  PATIENT: Robin Saunders  Surgical Pathology Report   Specimen Submitted:  A. Breast,  right; heart clip  B. Breast, right; venus clip   Clinical History: Oval mass.  DDX - FA, papilloma, malignancy. Site 1  heart shaped clip deployed. Site 2 venus shaped clip deployed.   DIAGNOSIS:  A. BREAST, RIGHT, 3:00, 5 CM FROM NIPPLE; ULTRASOUND-GUIDED BIOPSY:  - BIPHASIC STROMAL AND GLANDULAR PROLIFERATION CONSISTENT WITH  FIBROADENOMA.  - NEGATIVE FOR ATYPIA AND MALIGNANCY.   B. BREAST, RIGHT, 8:00, 4 CM FROM NIPPLE; ULTRASOUND-GUIDED BIOPSY:  - FIBROADENOMA.  - NEGATIVE FOR ATYPIA AND MALIGNANCY.    Imaging: Radiological images reviewed:  CLINICAL DATA:  Screening recall for right breast mass. Of note, she is status post ultrasound-guided biopsy of 2 right breast masses on July 14, 2022, both of which demonstrated benign fibroadenoma on pathology.   EXAM: ULTRASOUND OF THE RIGHT BREAST   COMPARISON:  Previous exam(s).   FINDINGS: Targeted right breast ultrasound was performed. At 3 o'clock 5 cm from the nipple, there is an oval circumscribed hypoechoic mass. It measures 3.5 x 1.3 x 3.3 cm, previously measuring 3.0 x 1.0 x 2.7 cm on June 21, 2022. This was previously biopsied on July 14, 2022, with pathology consistent with fibroadenoma. An echogenic focus within the mass corresponds with the heart shaped biopsy marking clip.   IMPRESSION: Enlarging mass in the right breast 3 o'clock position, which was previously biopsied in July 2023 with pathology consistent with benign fibroadenoma. Recommend surgical excision given interval increase in size.   RECOMMENDATION: Surgical consultation for consideration of excision of enlarging right breast mass.   I have discussed the findings and recommendations with the patient. If applicable, a reminder letter will be sent to the patient regarding the next appointment.   BI-RADS CATEGORY  4: Suspicious.      Electronically Signed   By: Jacob Moores M.D.   On: 07/14/2023 10:41 Within last 24 hrs: No results found.  Assessment    Fibroadenoma right breast, slight increase in size from prior imaging approximately or the year. There are no problems to display for this patient.   Plan    Will follow-up in 6 months with a repeat right breast ultrasound.  Clinical examination either in 3 months or at the 71-month interval per patient's desire.  She will continue to perform breast self-examination on a monthly basis.  Will be glad to see her again within the interval should any concerns arise. She accepts the small risk that a delayed diagnosis may occur by deferring excision of this lesion at the present time.  I believe this risk is acceptable, I will continue to follow her in cooperation.  Face-to-face time spent with the patient and accompanying care providers(if present) was 30 minutes, with more than 50% of the time spent counseling, educating, and coordinating care of the patient.    These notes generated with voice recognition software. I apologize for typographical errors.  Campbell Lerner M.D., FACS 07/27/2023, 3:11 PM

## 2023-07-27 ENCOUNTER — Ambulatory Visit: Payer: BC Managed Care – PPO | Admitting: Surgery

## 2023-07-27 VITALS — BP 144/84 | HR 74 | Temp 98.5°F | Ht 66.0 in | Wt 190.6 lb

## 2023-07-27 DIAGNOSIS — D241 Benign neoplasm of right breast: Secondary | ICD-10-CM

## 2023-07-27 NOTE — Patient Instructions (Signed)
If you have any concerns or questions, please feel free to call our office.   Fibroadenoma  A fibroadenoma is a lump (tumor) in the breast. The lump is benign. This means that it is not cancer. It may move under your skin when you touch it. This kind of lump can grow in one breast or in both breasts. What are the causes? The cause of this condition is not known. What increases the risk? Being a woman between the ages of 33 and 57. Being a woman of African American descent. What are the signs or symptoms? Some lumps are too small to be felt. If you can feel it, it may feel like a lump that is: Firm. Round. Smooth. Able to move around a bit. How is this treated? Regular breast exams are done to check for changes in the lump. In some cases, the lump may be removed if: It is large. It keeps growing. It causes pain or changes in the skin of the breast. A young girl has a lump. Lumps in young girls tend to grow over time. Follow these instructions at home: Breast exams Check your breasts at home as told by your doctor. Report any changes or concerns. Check for the following: The size of the lump. The look and feel of the skin of your breasts. The look and feel of your nipples.  General instructions Do not smoke or use any products that contain nicotine or tobacco. These can further increase your cancer risk. If you need help quitting, ask your doctor. Keep all follow-up visits. You will need breast exams on a regular basis. Contact a doctor if: The lump changes in size or feels different. The lump starts to be painful. You find a new lump. You have any changes in how the skin on your breast looks. You have any changes in your nipple, such as: Fluid leaking from your nipple. Redness around your nipple. Summary A fibroadenoma is a lump (tumor) in the breast. The lump is benign. This means that it is not cancer. This may feel firm, round, or smooth, and it may move around a bit when  touched. Some lumps are too small to be felt. Do breast exams at home. Watch for changes in the size of the lump. Contact your doctor if the lump grows bigger or starts to cause pain. Also, let your doctor know if you have any changes in your nipple or in how the skin on your breast looks. This information is not intended to replace advice given to you by your health care provider. Make sure you discuss any questions you have with your health care provider. Document Revised: 10/12/2020 Document Reviewed: 10/12/2020 Elsevier Patient Education  2024 [employer].

## 2023-12-05 ENCOUNTER — Other Ambulatory Visit: Payer: Self-pay

## 2023-12-05 DIAGNOSIS — D241 Benign neoplasm of right breast: Secondary | ICD-10-CM

## 2023-12-11 ENCOUNTER — Other Ambulatory Visit: Payer: Self-pay

## 2023-12-11 DIAGNOSIS — D241 Benign neoplasm of right breast: Secondary | ICD-10-CM

## 2024-01-10 ENCOUNTER — Ambulatory Visit
Admission: RE | Admit: 2024-01-10 | Discharge: 2024-01-10 | Disposition: A | Discharge status: Home / Self Care | Payer: BC Managed Care – PPO | Source: Ambulatory Visit | Attending: Surgery | Admitting: Surgery

## 2024-01-10 DIAGNOSIS — D241 Benign neoplasm of right breast: Secondary | ICD-10-CM

## 2024-01-11 NOTE — Progress Notes (Unsigned)
Patient ID: Robin Saunders, female   DOB: 05-24-1981, 43 y.o.   MRN: [id number]  Chief Complaint: Fibroadenoma right breast  History of Present Illness Robin Saunders is a 44 y.o. female with history of multiple fibroadenomas and core biopsies.  Last biopsy was obtained July 2023.  Follow-up ultrasound imaging, prompted surgical evaluation for a slight increase over the year and size of the readily appreciated medial right breast fibroadenoma.  She is not extremely concerned or worried about the etiology of this lesion despite its slight change in size.  She is expressed a desire to avoid surgery, I believe it may be reasonable to continue to follow this with imaging at the present time.  Past Medical History Past Medical History:  Diagnosis Date   Anxiety    Cyst, ovarian    High cholesterol       Past Surgical History:  Procedure Laterality Date   BREAST BIOPSY Right 03/14/2018   10:30 coil fibroadeoma   BREAST BIOPSY Left 03/14/2018   12:00 ribbon fibroadeoma   BREAST BIOPSY Left 03/14/2018   1:00 wing fibroadeoma   BREAST BIOPSY Right 07/14/2022   [place] Bx, 3:00 5 CMFN, Heart Clip,  BIPHASIC STROMAL AND GLANDULAR PROLIFERATION CONSISTENT WITH FIBROADENOMA. - NEGATIVE FOR ATYPIA AND MALIGNANCY.   BREAST BIOPSY Right 07/14/2022   [place] Bx, 8:00 4CMFN, Venus Clip, BIPHASIC STROMAL AND GLANDULAR PROLIFERATION CONSISTENT WITH FIBROADENOMA. - NEGATIVE FOR ATYPIA AND MALIGNANCY.   CHOLECYSTECTOMY      Allergies  Allergen Reactions   Shellfish Allergy    Tylenol [Acetaminophen] Hives    Current Outpatient Medications  Medication Sig Dispense Refill   aspirin 81 MG tablet Take 81 mg by mouth daily.     atorvastatin (LIPITOR) 10 MG tablet Take 10 mg by mouth daily.     busPIRone (BUSPAR) 15 MG tablet Take 1 tablet by mouth 2 (two) times daily.     clonazePAM (KLONOPIN) 0.5 MG tablet Take 1 tablet by mouth daily.     dicyclomine (BENTYL) 20 MG tablet Take 1 tablet (20 mg total) by mouth 3  (three) times daily as needed for spasms. 30 tablet 0   FLUoxetine (PROZAC) 20 MG capsule Take 20 mg by mouth daily.     metoprolol tartrate (LOPRESSOR) 100 MG tablet Take tablet (100mg ) TWO hours prior to your cardiac CT scan. 1 tablet 0   sertraline (ZOLOFT) 25 MG tablet Take 2 tablets by mouth at bedtime.      sertraline (ZOLOFT) 50 MG tablet Take 1 tablet by mouth daily.  0   No current facility-administered medications for this visit.    Family History Family History  Problem Relation Age of Onset   Cancer Mother    Heart attack Mother    Heart attack Father    Breast cancer Neg Hx       Social History Social History   Tobacco Use   Smoking status: Every Day    Current packs/day: 1.00    Types: Cigarettes   Smokeless tobacco: Never  Vaping Use   Vaping status: Never Used  Substance Use Topics   Alcohol use: No   Drug use: Never        Review of Systems  Constitutional: Negative.   HENT: Negative.    Eyes: Negative.   Respiratory: Negative.    Cardiovascular: Negative.   Gastrointestinal: Negative.   Genitourinary: Negative.   Skin: Negative.   Neurological: Negative.   Psychiatric/Behavioral: Negative.       Physical  Exam Last menstrual period 12/13/2023.    CONSTITUTIONAL: Well developed, and nourished, appropriately responsive and aware without distress.   EYES: Sclera non-icteric.   EARS, NOSE, MOUTH AND THROAT:  The oropharynx is clear. Oral mucosa is pink and moist.     Hearing is intact to voice.  NECK: Trachea is midline, and there is no jugular venous distension.  LYMPH NODES:  Lymph nodes in the neck are not appreciated. RESPIRATORY:  Lungs are clear, and breath sounds are equal bilaterally.  Normal respiratory effort without pathologic use of accessory muscles. CARDIOVASCULAR: Heart is regular in rate and rhythm.   Well perfused.  GI: The abdomen is  soft, nontender, and nondistended. There were no palpable masses.  GU: [name] was present  as chaperone.  Most notable was the readily palpable and will defined/discrete mass consistent with the 1 of concern in the central medial right breast.  This is consistent with a fibroadenoma, had been previously biopsied as such.  And considering the patient's premenopausal status would allow for some degree of growth of these lesions.  There were no other suspicious or dominant findings in the breast aside from small discrete lesions that I patient is very familiar with.  There are no adjacent skin changes, edema, nipple discharge present. MUSCULOSKELETAL:  Symmetrical muscle tone appreciated in all four extremities.    SKIN: Skin turgor is normal. No pathologic skin lesions appreciated.  NEUROLOGIC:  Motor and sensation appear grossly normal.  Cranial nerves are grossly without defect. PSYCH:  Alert and oriented to person, place and time. Affect is appropriate for situation.  Data Reviewed I have personally reviewed what is currently available of the patient's imaging, recent labs and medical records.   Labs:     Latest Ref Rng & Units 09/06/2020    8:49 PM 08/24/2019   10:58 AM 05/08/2018   12:21 PM  CBC  WBC 4.0 - 10.5 K/uL 9.8  10.3  11.3   Hemoglobin 12.0 - 15.0 g/dL 77.8  24.2  35.3   Hematocrit 36.0 - 46.0 % 38.6  44.8  38.6   Platelets 150 - 400 K/uL 303  309  310       Latest Ref Rng & Units 09/06/2020    8:49 PM 08/24/2019   10:58 AM 05/08/2018   12:21 PM  CMP  Glucose 70 - 99 mg/dL 89  89  92   BUN 6 - 20 mg/dL 9  5  7    Creatinine 0.44 - 1.00 mg/dL 6.14  4.31  5.40   Sodium 135 - 145 mmol/L 139  136  136   Potassium 3.5 - 5.1 mmol/L 3.7  4.0  3.4   Chloride 98 - 111 mmol/L 107  105  103   CO2 22 - 32 mmol/L 24  23  25    Calcium 8.9 - 10.3 mg/dL 8.8  9.1  9.0   Total Protein 6.5 - 8.1 g/dL  7.5  8.0   Total Bilirubin 0.3 - 1.2 mg/dL  0.7  1.0   Alkaline Phos 38 - 126 U/L  42  56   AST 15 - 41 U/L  16  15   ALT 0 - 44 U/L  12  13    SURGICAL PATHOLOGY  CASE:  [phone]  PATIENT: [name]  Surgical Pathology Report   Specimen Submitted:  A. Breast, right; heart clip  B. Breast, right; venus clip   Clinical History: Oval mass.  DDX - FA, papilloma, malignancy. Site 1  heart shaped clip deployed. Site 2 venus shaped clip deployed.   DIAGNOSIS:  A. BREAST, RIGHT, 3:00, 5 CM FROM NIPPLE; ULTRASOUND-GUIDED BIOPSY:  - BIPHASIC STROMAL AND GLANDULAR PROLIFERATION CONSISTENT WITH  FIBROADENOMA.  - NEGATIVE FOR ATYPIA AND MALIGNANCY.   B. BREAST, RIGHT, 8:00, 4 CM FROM NIPPLE; ULTRASOUND-GUIDED BIOPSY:  - FIBROADENOMA.  - NEGATIVE FOR ATYPIA AND MALIGNANCY.    Imaging: Radiological images reviewed:  CLINICAL DATA:  Screening recall for right breast mass. Of note, she is status post ultrasound-guided biopsy of 2 right breast masses on July 14, 2022, both of which demonstrated benign fibroadenoma on pathology.   EXAM: ULTRASOUND OF THE RIGHT BREAST   COMPARISON:  Previous exam(s).   FINDINGS: Targeted right breast ultrasound was performed. At 3 o'clock 5 cm from the nipple, there is an oval circumscribed hypoechoic mass. It measures 3.5 x 1.3 x 3.3 cm, previously measuring 3.0 x 1.0 x 2.7 cm on June 21, 2022. This was previously biopsied on July 14, 2022, with pathology consistent with fibroadenoma. An echogenic focus within the mass corresponds with the heart shaped biopsy marking clip.   IMPRESSION: Enlarging mass in the right breast 3 o'clock position, which was previously biopsied in July 2023 with pathology consistent with benign fibroadenoma. Recommend surgical excision given interval increase in size.   RECOMMENDATION: Surgical consultation for consideration of excision of enlarging right breast mass.   I have discussed the findings and recommendations with the patient. If applicable, a reminder letter will be sent to the patient regarding the next appointment.   BI-RADS CATEGORY  4: Suspicious.      Electronically Signed   By: Jacob Moores M.D.   On: 07/14/2023 10:41 Within last 24 hrs: MM 3D DIAGNOSTIC MAMMOGRAM UNILATERAL RIGHT BREAST Result Date: 01/10/2024 CLINICAL DATA:  Patient is here in follow-up of right breast 3 o'clock enlarging mass. This mass was sampled in July 2023 with pathology demonstrating benign fibroadenoma, however was noted to increase in size on screening dated July 2024. The patient underwent surgical consultation after her last mammogram in July 2024, which resulted in a decision for imaging follow-up rather than excisional biopsy. EXAM: DIGITAL DIAGNOSTIC UNILATERAL RIGHT MAMMOGRAM WITH TOMOSYNTHESIS AND CAD; ULTRASOUND RIGHT BREAST LIMITED TECHNIQUE: Right digital diagnostic mammography and breast tomosynthesis was performed. The images were evaluated with computer-aided detection. ; Targeted ultrasound examination of the right breast was performed COMPARISON:  Previous exam(s). ACR Breast Density Category c: The breasts are heterogeneously dense, which may obscure small masses. FINDINGS: Mammographically, there are no new suspicious masses, areas of architectural distortion or microcalcifications in the right breast. The previously demonstrated right breast 3 o'clock circumscribed mass, associated with heart shaped marker has slightly increased in size from patient's prior mammogram. Stable smaller mass in the upper outer right breast, posterior depth, biopsy-proven fibroadenoma. Targeted right breast ultrasound is performed no straight 3 o'clock 5 cm from nipple hypoechoic circumscribed horizontally oriented mass measuring 3.2 x 3.7 x 1.2 cm. The mass measured 2.7 x 1.0 x 3.0 cm on June 21, 2022 and 3.3 x 3.5 x 1.3 cm on July 14, 2023. IMPRESSION: Slightly increased in size right breast 3 o'clock mass, which was previously biopsied in July 2023 with pathology consistent with benign fibroadenoma. Recommend surgical consultation to consider excision or follow-up.  RECOMMENDATION: Surgical follow-up. I have discussed the findings and recommendations with the patient. If applicable, a reminder letter will be sent to the patient regarding the next appointment. BI-RADS CATEGORY  3: Probably benign. Electronically Signed  By: Ted Mcalpine M.D.   On: 01/10/2024 14:51   [place] LIMITED ULTRASOUND INCLUDING AXILLA RIGHT BREAST Result Date: 01/10/2024 CLINICAL DATA:  Patient is here in follow-up of right breast 3 o'clock enlarging mass. This mass was sampled in July 2023 with pathology demonstrating benign fibroadenoma, however was noted to increase in size on screening dated July 2024. The patient underwent surgical consultation after her last mammogram in July 2024, which resulted in a decision for imaging follow-up rather than excisional biopsy. EXAM: DIGITAL DIAGNOSTIC UNILATERAL RIGHT MAMMOGRAM WITH TOMOSYNTHESIS AND CAD; ULTRASOUND RIGHT BREAST LIMITED TECHNIQUE: Right digital diagnostic mammography and breast tomosynthesis was performed. The images were evaluated with computer-aided detection. ; Targeted ultrasound examination of the right breast was performed COMPARISON:  Previous exam(s). ACR Breast Density Category c: The breasts are heterogeneously dense, which may obscure small masses. FINDINGS: Mammographically, there are no new suspicious masses, areas of architectural distortion or microcalcifications in the right breast. The previously demonstrated right breast 3 o'clock circumscribed mass, associated with heart shaped marker has slightly increased in size from patient's prior mammogram. Stable smaller mass in the upper outer right breast, posterior depth, biopsy-proven fibroadenoma. Targeted right breast ultrasound is performed no straight 3 o'clock 5 cm from nipple hypoechoic circumscribed horizontally oriented mass measuring 3.2 x 3.7 x 1.2 cm. The mass measured 2.7 x 1.0 x 3.0 cm on June 21, 2022 and 3.3 x 3.5 x 1.3 cm on July 14, 2023. IMPRESSION: Slightly  increased in size right breast 3 o'clock mass, which was previously biopsied in July 2023 with pathology consistent with benign fibroadenoma. Recommend surgical consultation to consider excision or follow-up. RECOMMENDATION: Surgical follow-up. I have discussed the findings and recommendations with the patient. If applicable, a reminder letter will be sent to the patient regarding the next appointment. BI-RADS CATEGORY  3: Probably benign. Electronically Signed   By: Ted Mcalpine M.D.   On: 01/10/2024 14:51    Assessment    Fibroadenoma right breast, slight increase in size from prior imaging approximately or the year. 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  Clinical examination either in 3 months or at the 86-month interval per patient's desire.  She will continue to perform breast self-examination on a monthly basis.  Will be glad to see her again within the interval should any concerns arise. She accepts the small risk that a delayed diagnosis may occur by deferring excision of this lesion at the present time.  I believe this risk is acceptable, I will continue to follow her in cooperation.  Face-to-face time spent with the patient and accompanying care providers(if present) was 30 minutes, with more than 50% of the time spent counseling, educating, and coordinating care of the patient.    These notes generated with voice recognition software. I apologize for typographical errors.  Campbell Lerner M.D., FACS 01/11/2024, 1:16 PM

## 2024-01-18 ENCOUNTER — Encounter: Payer: Self-pay | Admitting: Surgery

## 2024-01-18 ENCOUNTER — Ambulatory Visit (INDEPENDENT_AMBULATORY_CARE_PROVIDER_SITE_OTHER): Payer: BC Managed Care – PPO | Admitting: Surgery

## 2024-01-18 VITALS — BP 147/90 | HR 72 | Temp 98.6°F | Ht 66.0 in | Wt 197.2 lb

## 2024-01-18 DIAGNOSIS — D241 Benign neoplasm of right breast: Secondary | ICD-10-CM

## 2024-01-18 NOTE — Patient Instructions (Addendum)
Follow up in 6 months.   Non-cancerous Breast Tumor (Fibroadenoma): What to Know  A fibroadenoma is an abnormal growth of tissue (tumor) in your breast. It's benign. This means that it's not cancer. It may feel like a lump. If you get this type of lump, you may be more likely to get breast cancer in the future. What are the causes? The cause isn't known. What increases the risk? Being female and: 84-44 years old. Black. Having genes that make you more likely to get this type of tumor. What are the signs or symptoms? Some lumps are too small to be felt. If you can feel it, it may feel: Firm. Rubbery. Round. Smooth. Able to move around a bit. You may have just one lump or more than one lump. You can get these lumps in one or both breasts. How is this treated? Regular breast exams are done to check for changes in the lump. In some cases, the lump may be taken out if: It's big. It keeps growing. It causes pain or other symptoms. You're a teen. Lumps in teens tend to grow over time. Follow these instructions at home: Breast exams General instructions  Check your breasts at home as told. Check the skin of your breast and nipples for any changes. Do not smoke, vape, or use nicotine or tobacco. Keep all follow-up visits. Your doctor will check your breasts for any changes. Contact a doctor if: The lump: Gets bigger. Feels different. Starts to hurt. You have any other changes in your breast, such as: A new lump. Changes in how your skin looks. Redness. Fluid leaking from your nipple. This information is not intended to replace advice given to you by your health care provider. Make sure you discuss any questions you have with your health care provider. Document Revised: 06/08/2023 Document Reviewed: 06/08/2023 Elsevier Patient Education  2024 [employer].

## 2024-05-23 ENCOUNTER — Other Ambulatory Visit: Payer: Self-pay

## 2024-05-23 DIAGNOSIS — D241 Benign neoplasm of right breast: Secondary | ICD-10-CM

## 2024-07-04 ENCOUNTER — Other Ambulatory Visit: Payer: Self-pay | Admitting: Medical Genetics

## 2024-07-05 ENCOUNTER — Other Ambulatory Visit
Admission: RE | Admit: 2024-07-05 | Discharge: 2024-07-05 | Disposition: A | Discharge status: Home / Self Care | Payer: Self-pay | Source: Ambulatory Visit | Attending: Medical Genetics | Admitting: Medical Genetics

## 2024-07-10 ENCOUNTER — Ambulatory Visit
Admission: RE | Admit: 2024-07-10 | Discharge: 2024-07-10 | Disposition: A | Discharge status: Home / Self Care | Source: Ambulatory Visit | Attending: Surgery | Admitting: Surgery

## 2024-07-10 DIAGNOSIS — D241 Benign neoplasm of right breast: Secondary | ICD-10-CM | POA: Insufficient documentation

## 2024-07-17 NOTE — Progress Notes (Unsigned)
 Patient ID: Robin Saunders, female   DOB: [birth year], 43 y.o.   MRN: [id number]  Chief Complaint: Fibroadenoma right breast  History of Present Illness Robin Saunders is a 43 y.o. female with history of multiple fibroadenomas and core biopsies.  Last biopsy was obtained July 2023.  Follow-up ultrasound imaging, prompted surgical evaluation initially for a slight increase over the first year and size of the readily appreciated medial right breast fibroadenoma.  Since then her imaging has been quite reassuring.   She is expressed a desire to continue to avoid surgery, I believe it is quite reasonable to continue to follow this with imaging at the present time.  Past Medical History Past Medical History:  Diagnosis Date   Anxiety    Cyst, ovarian    High cholesterol       Past Surgical History:  Procedure Laterality Date   BREAST BIOPSY Right 03/14/2018   10:30 coil fibroadeoma   BREAST BIOPSY Left 03/14/2018   12:00 ribbon fibroadeoma   BREAST BIOPSY Left 03/14/2018   1:00 wing fibroadeoma   BREAST BIOPSY Right 07/14/2022   US  Bx, 3:00 5 CMFN, Heart Clip,  BIPHASIC STROMAL AND GLANDULAR PROLIFERATION CONSISTENT WITH FIBROADENOMA. - NEGATIVE FOR ATYPIA AND MALIGNANCY.   BREAST BIOPSY Right 07/14/2022   US  Bx, 8:00 4CMFN, Venus Clip, BIPHASIC STROMAL AND GLANDULAR PROLIFERATION CONSISTENT WITH FIBROADENOMA. - NEGATIVE FOR ATYPIA AND MALIGNANCY.   CHOLECYSTECTOMY      Allergies  Allergen Reactions   Shellfish Allergy    Tylenol  [Acetaminophen ] Hives    Current Outpatient Medications  Medication Sig Dispense Refill   atorvastatin (LIPITOR) 10 MG tablet Take 10 mg by mouth daily.     No current facility-administered medications for this visit.    Family History Family History  Problem Relation Age of Onset   Cancer Mother    Heart attack Mother    Heart attack Father    Breast cancer Neg Hx       Social History Social History   Tobacco Use   Smoking status: Every Day     Current packs/day: 1.00    Types: Cigarettes   Smokeless tobacco: Never  Vaping Use   Vaping status: Never Used  Substance Use Topics   Alcohol use: No   Drug use: Never        Review of Systems  Constitutional: Negative.   HENT: Negative.    Eyes: Negative.   Respiratory: Negative.    Cardiovascular: Negative.   Gastrointestinal: Negative.   Genitourinary: Negative.   Skin: Negative.   Neurological: Negative.   Psychiatric/Behavioral: Negative.       Physical Exam Blood pressure (!) 150/89, pulse 85, temperature 98.8 F (37.1 C), temperature source Oral, height 5' 6 (1.676 m), weight 208 lb 6.4 oz (94.5 kg), last menstrual period 06/13/2024, SpO2 98%. Last Weight  Most recent update: 07/18/2024  1:15 PM    Weight  94.5 kg (208 lb 6.4 oz)              CONSTITUTIONAL: Well developed, and nourished, appropriately responsive and aware without distress.   EYES: Sclera non-icteric.   EARS, NOSE, MOUTH AND THROAT:  The oropharynx is clear. Oral mucosa is pink and moist.     Hearing is intact to voice.  NECK: Trachea is midline, and there is no jugular venous distension.  LYMPH NODES:  Lymph nodes in the neck are not appreciated. RESPIRATORY:  Lungs are clear, and breath sounds are equal bilaterally.  Normal respiratory  effort without pathologic use of accessory muscles. CARDIOVASCULAR: Heart is regular in rate and rhythm.   Well perfused.  GI: The abdomen is  soft, nontender, and nondistended. There were no palpable masses.  GU: Robin Saunders was present as chaperone.  Breast exam.  There were no other suspicious or dominant findings in the breast aside from small discrete lesions that I patient is very familiar with.  There are no adjacent skin changes, edema, nipple discharge present. MUSCULOSKELETAL:  Symmetrical muscle tone appreciated in all four extremities.    SKIN: Skin turgor is normal. No pathologic skin lesions appreciated.  NEUROLOGIC:  Motor and sensation appear  grossly normal.  Cranial nerves are grossly without defect. PSYCH:  Alert and oriented to person, place and time. Affect is appropriate for situation.  Data Reviewed I have personally reviewed what is currently available of the patient's imaging, recent labs and medical records.   Labs:     Latest Ref Rng & Units 09/06/2020    8:49 PM 08/24/2019   10:58 AM 05/08/2018   12:21 PM  CBC  WBC 4.0 - 10.5 K/uL 9.8  10.3  11.3   Hemoglobin 12.0 - 15.0 g/dL 86.4  84.9  86.5   Hematocrit 36.0 - 46.0 % 38.6  44.8  38.6   Platelets 150 - 400 K/uL 303  309  310       Latest Ref Rng & Units 09/06/2020    8:49 PM 08/24/2019   10:58 AM 05/08/2018   12:21 PM  CMP  Glucose 70 - 99 mg/dL 89  89  92   BUN 6 - 20 mg/dL 9  5  7    Creatinine 0.44 - 1.00 mg/dL 9.30  9.35  9.26   Sodium 135 - 145 mmol/L 139  136  136   Potassium 3.5 - 5.1 mmol/L 3.7  4.0  3.4   Chloride 98 - 111 mmol/L 107  105  103   CO2 22 - 32 mmol/L 24  23  25    Calcium 8.9 - 10.3 mg/dL 8.8  9.1  9.0   Total Protein 6.5 - 8.1 g/dL  7.5  8.0   Total Bilirubin 0.3 - 1.2 mg/dL  0.7  1.0   Alkaline Phos 38 - 126 U/L  42  56   AST 15 - 41 U/L  16  15   ALT 0 - 44 U/L  12  13    SURGICAL PATHOLOGY  CASE: [id number]  PATIENT: Robin Saunders  Surgical Pathology Report   Specimen Submitted:  A. Breast, right; heart clip  B. Breast, right; venus clip   Clinical History: Oval mass.  DDX - FA, papilloma, malignancy. Site 1  heart shaped clip deployed. Site 2 venus shaped clip deployed.   DIAGNOSIS:  A. BREAST, RIGHT, 3:00, 5 CM FROM NIPPLE; ULTRASOUND-GUIDED BIOPSY:  - BIPHASIC STROMAL AND GLANDULAR PROLIFERATION CONSISTENT WITH  FIBROADENOMA.  - NEGATIVE FOR ATYPIA AND MALIGNANCY.   B. BREAST, RIGHT, 8:00, 4 CM FROM NIPPLE; ULTRASOUND-GUIDED BIOPSY:  - FIBROADENOMA.  - NEGATIVE FOR ATYPIA AND MALIGNANCY.    Imaging: Radiological images reviewed:    CLINICAL DATA:  43 year old female presenting for follow-up of  an enlarging mass in the RIGHT breast 3 o'clock position. This mass was biopsied in July 2023, with pathology demonstrating benign fibroadenoma (heart clip). However, it was noted to increase in size in July 2024 and surgical consultation for possible excision was recommended. Patient and surgeon have opted for imaging surveillance. Notably, she has multiple additional  bilateral biopsy-proven fibroadenomas.   EXAM: DIGITAL DIAGNOSTIC BILATERAL MAMMOGRAM WITH TOMOSYNTHESIS AND CAD; ULTRASOUND RIGHT BREAST LIMITED   TECHNIQUE: Bilateral digital diagnostic mammography and breast tomosynthesis was performed. The images were evaluated with computer-aided detection. ; Targeted ultrasound examination of the right breast was performed   COMPARISON:  Previous exam(s).   ACR Breast Density Category c: The breasts are heterogeneously dense, which may obscure small masses.   FINDINGS: MAMMOGRAM:   Stable size of oval circumscribed mass in the inner central right breast middle depth, measuring up to 3.4 cm. There is an associated heart shaped biopsy marking clip, with pathology demonstrating benign fibroadenoma.   There are multiple additional bilateral oval circumscribed masses, some of which have fluctuated in size, most consistent with benign fibroadenomas and or benign cysts.   No new suspicious mass, calcification, or other findings are identified in bilateral breasts.   ULTRASOUND:   Targeted right breast ultrasound was performed. 3 o'clock 5 cm from nipple, there is an oval circumscribed hypoechoic mass. It measures up to 3.5 x 3.2 x 1.0 cm, previously measuring up to 3.5 x 1.3 x 3.3 cm on July 14, 2023. There is an associated biopsy marking clip within the center of the mass. This was previously biopsied with pathology demonstrating benign fibroadenoma.   IMPRESSION: 1. Stable mass in the RIGHT breast 3 o'clock position, previously biopsied with pathology demonstrating  benign fibroadenoma. This mass is stable compared to July 2024 and had slightly increased in size since July 2023. The patient has established care with Dr. Honor Leghorn and is currently opting for imaging surveillance. 2. No evidence of malignancy in BILATERAL breasts.   RECOMMENDATION: 1. Continued surgical management of the RIGHT breast biopsy-proven fibroadenoma in the 3 o'clock position. 2. If continued imaging surveillance is desired, recommend BILATERAL diagnostic mammogram and RIGHT breast ultrasound in 12 months. If excision is pursued, recommend annual screening mammography, next due in July 2026.   I have discussed the findings and recommendations with the patient. If applicable, a reminder letter will be sent to the patient regarding the next appointment.   BI-RADS CATEGORY  2: Benign.     Electronically Signed   By: Dirk Arrant M.D.   On: 07/10/2024 16:12 Within last 24 hrs: No results found.   Assessment    Fibroadenoma right breast, slight increase in size from prior imaging approximately over the year. 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  She will continue to perform breast self-examination on a monthly basis.  Will be glad to see her again within the interval should any concerns arise. She accepts the small risk that a delayed diagnosis may occur by deferring excision of this lesion at the present time.  I believe this risk is acceptable, I will continue to follow her in cooperation.  I personally spent a total of 20 minutes in the care of the patient today including preparing to see the patient, getting/reviewing separately obtained history, performing a medically appropriate exam/evaluation, counseling and educating, documenting clinical information in the EHR, independently  interpreting results, and communicating results.   These notes generated with voice recognition software. I apologize for typographical errors.  Honor Leghorn M.D., FACS 07/18/2024, 1:38 PM

## 2024-07-18 ENCOUNTER — Encounter: Payer: Self-pay | Admitting: Surgery

## 2024-07-18 ENCOUNTER — Ambulatory Visit (INDEPENDENT_AMBULATORY_CARE_PROVIDER_SITE_OTHER): Admitting: Surgery

## 2024-07-18 VITALS — BP 150/89 | HR 85 | Temp 98.8°F | Ht 66.0 in | Wt 208.4 lb

## 2024-07-18 DIAGNOSIS — D241 Benign neoplasm of right breast: Secondary | ICD-10-CM

## 2024-07-18 NOTE — Patient Instructions (Addendum)
 We will see you back in 6 months, our schedule is not available yet, so we placed you in our recall system and will send you a letter with a date and time to come back to see Dr Lane       Non-cancerous Breast Tumor (Fibroadenoma): What to Know  A fibroadenoma is an abnormal growth of tissue (tumor) in your breast. It's benign. This means that it's not cancer. It may feel like a lump. If you get this type of lump, you may be more likely to get breast cancer in the future. What are the causes? The cause isn't known. What increases the risk? Being female and: 80-74 years old. Black. Having genes that make you more likely to get this type of tumor. What are the signs or symptoms? Some lumps are too small to be felt. If you can feel it, it may feel: Firm. Rubbery. Round. Smooth. Able to move around a bit. You may have just one lump or more than one lump. You can get these lumps in one or both breasts. How is this treated? Regular breast exams are done to check for changes in the lump. In some cases, the lump may be taken out if: It's big. It keeps growing. It causes pain or other symptoms. You're a teen. Lumps in teens tend to grow over time. Follow these instructions at home: Breast exams General instructions  Check your breasts at home as told. Check the skin of your breast and nipples for any changes. Do not smoke, vape, or use nicotine or tobacco. Keep all follow-up visits. Your doctor will check your breasts for any changes. Contact a doctor if: The lump: Gets bigger. Feels different. Starts to hurt. You have any other changes in your breast, such as: A new lump. Changes in how your skin looks. Redness. Fluid leaking from your nipple. This information is not intended to replace advice given to you by your health care provider. Make sure you discuss any questions you have with your health care provider. Document Revised: 06/08/2023 Document Reviewed:  06/08/2023 Elsevier Patient Education  2024 [employer].

## 2024-07-19 LAB — GENECONNECT MOLECULAR SCREEN: Genetic Analysis Overall Interpretation: NEGATIVE

## 2024-12-04 ENCOUNTER — Other Ambulatory Visit: Payer: Self-pay

## 2024-12-04 DIAGNOSIS — D241 Benign neoplasm of right breast: Secondary | ICD-10-CM
